# Patient Record
Sex: Female | Born: 1960
Health system: Southern US, Community
[De-identification: ages and names within clinical notes are randomized; demographics above are authoritative.]

## PROBLEM LIST (undated history)

## (undated) DIAGNOSIS — F419 Anxiety disorder, unspecified: Secondary | ICD-10-CM

## (undated) DIAGNOSIS — N3090 Cystitis, unspecified without hematuria: Secondary | ICD-10-CM

## (undated) DIAGNOSIS — K602 Anal fissure, unspecified: Secondary | ICD-10-CM

## (undated) HISTORY — DX: Anal fissure, unspecified: K60.2

## (undated) HISTORY — PX: TONSILLECTOMY: SUR1361

## (undated) HISTORY — PX: TOE SURGERY: SHX1073

## (undated) HISTORY — DX: Anxiety disorder, unspecified: F41.9

## (undated) HISTORY — DX: Cystitis, unspecified without hematuria: N30.90

---

## 2005-06-17 ENCOUNTER — Ambulatory Visit (HOSPITAL_BASED_OUTPATIENT_CLINIC_OR_DEPARTMENT_OTHER): Admission: RE | Admit: 2005-06-17 | Discharge: 2005-06-17 | Payer: Self-pay | Admitting: Urology

## 2013-07-27 DIAGNOSIS — N9489 Other specified conditions associated with female genital organs and menstrual cycle: Secondary | ICD-10-CM | POA: Insufficient documentation

## 2013-07-27 DIAGNOSIS — N3010 Interstitial cystitis (chronic) without hematuria: Secondary | ICD-10-CM | POA: Insufficient documentation

## 2014-07-27 DIAGNOSIS — F419 Anxiety disorder, unspecified: Secondary | ICD-10-CM | POA: Insufficient documentation

## 2014-07-27 DIAGNOSIS — R030 Elevated blood-pressure reading, without diagnosis of hypertension: Secondary | ICD-10-CM | POA: Insufficient documentation

## 2015-03-07 ENCOUNTER — Other Ambulatory Visit: Payer: Self-pay | Admitting: *Deleted

## 2015-03-07 LAB — FERRITIN: Ferritin: 98 ng/mL (ref 10–291)

## 2015-03-07 LAB — TSH: TSH: 1.036 u[IU]/mL (ref 0.350–4.500)

## 2015-03-07 NOTE — Addendum Note (Signed)
Addended by: Eulis Foster on: 03/07/2015 02:20 PM   Modules accepted: Orders

## 2015-03-08 LAB — THYROID PEROXIDASE ANTIBODY: Thyroperoxidase Ab SerPl-aCnc: <1 IU/mL (ref <9)

## 2015-03-08 LAB — DHEA-SULFATE: DHEA-SO4: 60 ug/dL (ref 8–188)

## 2015-07-10 DIAGNOSIS — Z8349 Family history of other endocrine, nutritional and metabolic diseases: Secondary | ICD-10-CM | POA: Diagnosis not present

## 2015-07-10 DIAGNOSIS — R209 Unspecified disturbances of skin sensation: Secondary | ICD-10-CM | POA: Diagnosis not present

## 2015-07-10 DIAGNOSIS — R899 Unspecified abnormal finding in specimens from other organs, systems and tissues: Secondary | ICD-10-CM | POA: Diagnosis not present

## 2015-08-21 DIAGNOSIS — I10 Essential (primary) hypertension: Secondary | ICD-10-CM | POA: Diagnosis not present

## 2015-08-21 DIAGNOSIS — F419 Anxiety disorder, unspecified: Secondary | ICD-10-CM | POA: Diagnosis not present

## 2015-08-21 DIAGNOSIS — B009 Herpesviral infection, unspecified: Secondary | ICD-10-CM | POA: Diagnosis not present

## 2015-10-18 ENCOUNTER — Ambulatory Visit: Payer: Self-pay | Admitting: Podiatry

## 2015-10-31 DIAGNOSIS — Z713 Dietary counseling and surveillance: Secondary | ICD-10-CM | POA: Diagnosis not present

## 2015-11-06 ENCOUNTER — Ambulatory Visit: Payer: Self-pay | Admitting: Podiatry

## 2015-11-21 DIAGNOSIS — Z713 Dietary counseling and surveillance: Secondary | ICD-10-CM | POA: Diagnosis not present

## 2015-12-12 DIAGNOSIS — Z713 Dietary counseling and surveillance: Secondary | ICD-10-CM | POA: Diagnosis not present

## 2015-12-26 DIAGNOSIS — Z713 Dietary counseling and surveillance: Secondary | ICD-10-CM | POA: Diagnosis not present

## 2016-01-24 DIAGNOSIS — N9489 Other specified conditions associated with female genital organs and menstrual cycle: Secondary | ICD-10-CM | POA: Diagnosis not present

## 2016-01-24 DIAGNOSIS — N3010 Interstitial cystitis (chronic) without hematuria: Secondary | ICD-10-CM | POA: Diagnosis not present

## 2016-01-30 DIAGNOSIS — H5213 Myopia, bilateral: Secondary | ICD-10-CM | POA: Diagnosis not present

## 2016-02-27 DIAGNOSIS — Z713 Dietary counseling and surveillance: Secondary | ICD-10-CM | POA: Diagnosis not present

## 2016-03-05 DIAGNOSIS — R202 Paresthesia of skin: Secondary | ICD-10-CM | POA: Diagnosis not present

## 2016-03-05 DIAGNOSIS — R030 Elevated blood-pressure reading, without diagnosis of hypertension: Secondary | ICD-10-CM | POA: Diagnosis not present

## 2016-03-05 DIAGNOSIS — E559 Vitamin D deficiency, unspecified: Secondary | ICD-10-CM | POA: Diagnosis not present

## 2016-03-05 DIAGNOSIS — E270 Other adrenocortical overactivity: Secondary | ICD-10-CM | POA: Diagnosis not present

## 2016-03-05 DIAGNOSIS — Z658 Other specified problems related to psychosocial circumstances: Secondary | ICD-10-CM | POA: Diagnosis not present

## 2016-03-05 DIAGNOSIS — R635 Abnormal weight gain: Secondary | ICD-10-CM | POA: Diagnosis not present

## 2016-03-05 DIAGNOSIS — Z0000 Encounter for general adult medical examination without abnormal findings: Secondary | ICD-10-CM | POA: Diagnosis not present

## 2016-03-12 DIAGNOSIS — Z01419 Encounter for gynecological examination (general) (routine) without abnormal findings: Secondary | ICD-10-CM | POA: Diagnosis not present

## 2016-03-12 DIAGNOSIS — I10 Essential (primary) hypertension: Secondary | ICD-10-CM | POA: Diagnosis not present

## 2016-03-12 DIAGNOSIS — N898 Other specified noninflammatory disorders of vagina: Secondary | ICD-10-CM | POA: Diagnosis not present

## 2016-03-12 DIAGNOSIS — Z79890 Hormone replacement therapy (postmenopausal): Secondary | ICD-10-CM | POA: Diagnosis not present

## 2016-03-12 DIAGNOSIS — Z1151 Encounter for screening for human papillomavirus (HPV): Secondary | ICD-10-CM | POA: Diagnosis not present

## 2016-03-18 DIAGNOSIS — R102 Pelvic and perineal pain: Secondary | ICD-10-CM | POA: Diagnosis not present

## 2016-03-27 DIAGNOSIS — L821 Other seborrheic keratosis: Secondary | ICD-10-CM | POA: Diagnosis not present

## 2016-03-27 DIAGNOSIS — L814 Other melanin hyperpigmentation: Secondary | ICD-10-CM | POA: Diagnosis not present

## 2016-03-27 DIAGNOSIS — L603 Nail dystrophy: Secondary | ICD-10-CM | POA: Diagnosis not present

## 2016-03-27 DIAGNOSIS — D225 Melanocytic nevi of trunk: Secondary | ICD-10-CM | POA: Diagnosis not present

## 2016-03-28 DIAGNOSIS — R1030 Lower abdominal pain, unspecified: Secondary | ICD-10-CM | POA: Diagnosis not present

## 2016-03-28 DIAGNOSIS — R3911 Hesitancy of micturition: Secondary | ICD-10-CM | POA: Diagnosis not present

## 2016-03-28 DIAGNOSIS — R030 Elevated blood-pressure reading, without diagnosis of hypertension: Secondary | ICD-10-CM | POA: Diagnosis not present

## 2016-04-11 DIAGNOSIS — J069 Acute upper respiratory infection, unspecified: Secondary | ICD-10-CM | POA: Diagnosis not present

## 2016-04-16 ENCOUNTER — Ambulatory Visit: Payer: Self-pay | Admitting: Podiatry

## 2016-06-02 DIAGNOSIS — R6889 Other general symptoms and signs: Secondary | ICD-10-CM | POA: Diagnosis not present

## 2016-06-02 DIAGNOSIS — R002 Palpitations: Secondary | ICD-10-CM | POA: Diagnosis not present

## 2016-06-02 DIAGNOSIS — R0789 Other chest pain: Secondary | ICD-10-CM | POA: Diagnosis not present

## 2016-07-24 DIAGNOSIS — N9489 Other specified conditions associated with female genital organs and menstrual cycle: Secondary | ICD-10-CM | POA: Diagnosis not present

## 2016-07-24 DIAGNOSIS — N3010 Interstitial cystitis (chronic) without hematuria: Secondary | ICD-10-CM | POA: Diagnosis not present

## 2016-12-10 DIAGNOSIS — Z713 Dietary counseling and surveillance: Secondary | ICD-10-CM | POA: Diagnosis not present

## 2016-12-31 DIAGNOSIS — Z713 Dietary counseling and surveillance: Secondary | ICD-10-CM | POA: Diagnosis not present

## 2017-01-28 DIAGNOSIS — Z713 Dietary counseling and surveillance: Secondary | ICD-10-CM | POA: Diagnosis not present

## 2017-01-29 DIAGNOSIS — N3010 Interstitial cystitis (chronic) without hematuria: Secondary | ICD-10-CM | POA: Diagnosis not present

## 2017-04-08 DIAGNOSIS — Z713 Dietary counseling and surveillance: Secondary | ICD-10-CM | POA: Diagnosis not present

## 2017-05-27 DIAGNOSIS — Z713 Dietary counseling and surveillance: Secondary | ICD-10-CM | POA: Diagnosis not present

## 2017-06-02 DIAGNOSIS — F439 Reaction to severe stress, unspecified: Secondary | ICD-10-CM | POA: Diagnosis not present

## 2017-06-02 DIAGNOSIS — R6889 Other general symptoms and signs: Secondary | ICD-10-CM | POA: Diagnosis not present

## 2017-06-02 DIAGNOSIS — E538 Deficiency of other specified B group vitamins: Secondary | ICD-10-CM | POA: Diagnosis not present

## 2017-07-23 DIAGNOSIS — R350 Frequency of micturition: Secondary | ICD-10-CM | POA: Insufficient documentation

## 2017-07-23 DIAGNOSIS — Z87440 Personal history of urinary (tract) infections: Secondary | ICD-10-CM | POA: Insufficient documentation

## 2017-07-23 DIAGNOSIS — N9489 Other specified conditions associated with female genital organs and menstrual cycle: Secondary | ICD-10-CM | POA: Diagnosis not present

## 2017-07-23 DIAGNOSIS — N3010 Interstitial cystitis (chronic) without hematuria: Secondary | ICD-10-CM | POA: Diagnosis not present

## 2017-10-08 ENCOUNTER — Ambulatory Visit: Payer: Self-pay | Admitting: Podiatry

## 2017-10-22 ENCOUNTER — Ambulatory Visit: Payer: Self-pay | Admitting: Podiatry

## 2017-10-29 DIAGNOSIS — L570 Actinic keratosis: Secondary | ICD-10-CM | POA: Diagnosis not present

## 2017-10-29 DIAGNOSIS — L603 Nail dystrophy: Secondary | ICD-10-CM | POA: Diagnosis not present

## 2017-10-29 DIAGNOSIS — L609 Nail disorder, unspecified: Secondary | ICD-10-CM | POA: Diagnosis not present

## 2017-11-17 ENCOUNTER — Encounter: Payer: Self-pay | Admitting: Podiatry

## 2017-11-17 ENCOUNTER — Ambulatory Visit: Payer: BLUE CROSS/BLUE SHIELD | Admitting: Podiatry

## 2017-11-17 DIAGNOSIS — A499 Bacterial infection, unspecified: Secondary | ICD-10-CM | POA: Diagnosis not present

## 2017-11-17 DIAGNOSIS — L608 Other nail disorders: Secondary | ICD-10-CM | POA: Diagnosis not present

## 2017-11-17 DIAGNOSIS — B351 Tinea unguium: Secondary | ICD-10-CM

## 2017-11-17 DIAGNOSIS — L603 Nail dystrophy: Secondary | ICD-10-CM | POA: Diagnosis not present

## 2017-11-17 NOTE — Progress Notes (Signed)
   Subjective:    Patient ID: Crystal Ware, female    DOB: 1961/02/03, 57 y.o.   MRN: [id number]  HPI 57 year old female presents the office today for concerns of thick, discolored toenails and she states that the left big toenail did crack come off partially.  It is mostly to both great toes as well as left second toenail.  This is been ongoing issue for her.  She had no treatment.  She does trim her nails at times.  She has no other concerns.   Review of Systems  All other systems reviewed and are negative.  History reviewed. No pertinent past medical history.  History reviewed. No pertinent surgical history.  No current outpatient medications on file.  No Known Allergies       Objective:   Physical Exam  General: AAO x3, NAD  Dermatological: Bilateral hallux nails as well as second digit toenails are hypertrophic, dystrophic with yellow-brown discoloration.  The other nails have some mild discoloration as well.  There is no pain in the nails there is evidence of any redness or drainage or any signs of infection.  There is no open lesions.  Vascular: Dorsalis Pedis artery and Posterior Tibial artery pedal pulses are 2/4 bilateral with immedate capillary fill time. There is no pain with calf compression, swelling, warmth, erythema.   Neruologic: Grossly intact via light touch bilateral.  Protective threshold with Semmes Wienstein monofilament intact to all pedal sites bilateral.   Musculoskeletal: HAV present. Muscular strength 5/5 in all groups tested bilateral.  Gait: Unassisted, Nonantalgic.     Assessment & Plan:  57 year old female with onychodystrophy, likely onychomycosis -Treatment options discussed including all alternatives, risks, and complications -Etiology of symptoms were discussed -We discussed nails as well as treatment.  I had seen her when she came to bring her aunt for nail trim we discussed options as well.  I did debride the nails today and sent this for  culture, pathology to [facility] labs.  We will call her once we have the results back on the culture.  In the meantime encouraged to call any questions or concerns any changes.  Trula Slade DPM

## 2017-11-17 NOTE — Patient Instructions (Signed)
If was nice to meet you today. If you have any questions or any further concerns, please feel fee to give me a call. You can call our office at [phone] or please feel fee to send me a message through MyChart.   

## 2017-11-18 DIAGNOSIS — R6889 Other general symptoms and signs: Secondary | ICD-10-CM | POA: Diagnosis not present

## 2017-11-18 DIAGNOSIS — L609 Nail disorder, unspecified: Secondary | ICD-10-CM | POA: Diagnosis not present

## 2017-11-18 DIAGNOSIS — Q383 Other congenital malformations of tongue: Secondary | ICD-10-CM | POA: Diagnosis not present

## 2017-11-25 DIAGNOSIS — H01001 Unspecified blepharitis right upper eyelid: Secondary | ICD-10-CM | POA: Diagnosis not present

## 2017-11-27 ENCOUNTER — Telehealth: Payer: Self-pay | Admitting: *Deleted

## 2017-11-27 NOTE — Telephone Encounter (Signed)
-----   Message from Trula Slade, DPM sent at 11/27/2017  7:23 AM EDT ----- Val- please let her know that the nail culture shows a bacterial infection in the nail. Can you please prescribe topical erythromycin ointment for the nail. She should do this for the next several weeks as the nail grows out.

## 2017-12-01 ENCOUNTER — Telehealth: Payer: Self-pay | Admitting: *Deleted

## 2017-12-01 MED ORDER — GENTAMICIN SULFATE 0.1 % EX OINT: TOPICAL_OINTMENT | CUTANEOUS | 2 refills | Status: DC

## 2017-12-01 MED ORDER — GENTAMICIN SULFATE 0.1 % EX OINT: 1.0000 "application " | TOPICAL_OINTMENT | Freq: Three times a day (TID) | CUTANEOUS | 2 refills | Status: DC

## 2017-12-01 NOTE — Telephone Encounter (Signed)
-----   Message from Trula Slade, DPM sent at 12/01/2017  2:44 PM EDT ----- Sorry, lets do topical gentamycin ointment  ----- Message ----- From: Andres Ege, RN Sent: 12/01/2017   9:59 AM EDT To: Trula Slade, DPM  Dr. Jacqualyn Posey, I need help with the prescription, I am not sure what I have found is what you are wanting. Marcy Siren ----- Message ----- From: Trula Slade, DPM Sent: 11/27/2017   7:23 AM EDT To: Andres Ege, RN  Val- please let her know that the nail culture shows a bacterial infection in the nail. Can you please prescribe topical erythromycin ointment for the nail. She should do this for the next several weeks as the nail grows out.

## 2017-12-01 NOTE — Telephone Encounter (Signed)
Dr. Jacqualyn Posey states Crystal Ware should continue the application of the gentamicin ointment to the right and left 1, 2nd toes for 3-4 weeks and cover with light dressing or clean white cotton socks.

## 2017-12-01 NOTE — Telephone Encounter (Signed)
Thanks. She is more than welcome to see her PCP for his. I would use the medication for the next several weeks 2 times a day to treat the bacterial infection in the nails. Consider removing the nails if needed. If it going to take a long time for the nails to grow out. She is also more than welcome to come in and we can discuss in more detail. Thanks.

## 2017-12-01 NOTE — Telephone Encounter (Signed)
Left message informing pt, Dr. Jacqualyn Posey had reviewed the test results and I could go over them with her.

## 2017-12-01 NOTE — Telephone Encounter (Signed)
Pt asked if she should see her PCP, because the toenails are a window into the body's health, or how many toes she should use it on, how many times a day and for how long. I told pt I would inform Dr. Jacqualyn Posey of her questions and call again. Pt states she would call tomorrow.

## 2017-12-01 NOTE — Telephone Encounter (Signed)
Faxed orders to [facility].

## 2017-12-04 ENCOUNTER — Telehealth: Payer: Self-pay | Admitting: Podiatry

## 2017-12-04 NOTE — Telephone Encounter (Signed)
Crystal Ware, this is Crystal Ware. I spoke with you earlier in the week about a prescription for an ointment you were going to call in for my toenails. I have a couple of more questions. If you get a chance to give me a call back today, I can let you know what I wanted to find out from Dr. Jacqualyn Posey. If you don't get a chance to call me back today, you could call me back Monday after 10 am. If you could try calling my home number first which is [phone] because I don't always have a good cell signal. Thanks.

## 2017-12-07 ENCOUNTER — Telehealth: Payer: Self-pay | Admitting: Podiatry

## 2017-12-07 NOTE — Telephone Encounter (Signed)
I spoke with Crystal Ware and answered her question of 12/04/2017, to apply the ointment 1-2 times daily for 2-3 weeks with a Q-tip then cover areas with bandaid or clean white cotton sock. I told Crystal Ware we had not received the PRE-cert fax from [facility] yet and I gave Crystal Ware my fax number [phone]. Crystal Ware requested copy of the fungal culture results.  I spoke with Dr. Jacqualyn Posey and he stated he would like to see her in one month. I mailed copy of fungal culture results and noted on the back of the results, Dr. Jacqualyn Posey wanted to see her in one month.

## 2017-12-07 NOTE — Telephone Encounter (Addendum)
Crystal Ware states she will fax a pre-cert for for gentamicin ointment to be completed and return. I gave the [phone] for fax.

## 2017-12-07 NOTE — Telephone Encounter (Signed)
Patient is wanting to know have you received a fax for her for a prior auth through [employer].

## 2017-12-09 ENCOUNTER — Telehealth: Payer: Self-pay | Admitting: *Deleted

## 2017-12-09 ENCOUNTER — Telehealth: Payer: Self-pay | Admitting: Podiatry

## 2017-12-09 DIAGNOSIS — L570 Actinic keratosis: Secondary | ICD-10-CM | POA: Diagnosis not present

## 2017-12-09 NOTE — Telephone Encounter (Signed)
Pt states that the gentamicin ointment is not covered, but the insurance will cover bactroban ointment. I told pt I would check with Dr. Jacqualyn Posey if he would like to change to the bactroban ointment but it may not cover the bacteria her toenails have. Pt states she will get the gentamicin ointment no matter the cost, I told pt I would ask Dr. Jacqualyn Posey. Then pt states no don't do that, she'll get the gentamicin ointment. I told her I would let Dr. Jacqualyn Posey know she is starting the ointment. Pt states she wants to know if that is what he wants her on and I told that he prescribed it specifically and she states the insurance said they would cover the gentamicin cream. I told her I could change to the gentamicin cream. Pt stated no, she thought the ointment may be absorbed better, I told her I could check.

## 2017-12-09 NOTE — Telephone Encounter (Signed)
[employer] denied coverage of gentamicin ointment.

## 2017-12-09 NOTE — Telephone Encounter (Signed)
This is [place] from [place]. I'm calling in regards to the gentamicin sulfate received on Ms. Schoneman. This medication has been denied and the member has been made of this determination. If you have any questions, feel free to call our provider line anytime.

## 2017-12-10 NOTE — Telephone Encounter (Signed)
Can also consider cipro drops with dexamethasone otic suspension to apply to the TOENAIL. This may be do better for her and I discussed this with Dr. Milinda Pointer and that is what he does for this.

## 2017-12-11 ENCOUNTER — Encounter: Payer: Self-pay | Admitting: Podiatry

## 2017-12-11 ENCOUNTER — Ambulatory Visit (INDEPENDENT_AMBULATORY_CARE_PROVIDER_SITE_OTHER): Payer: BLUE CROSS/BLUE SHIELD

## 2017-12-11 ENCOUNTER — Ambulatory Visit: Payer: BLUE CROSS/BLUE SHIELD | Admitting: Podiatry

## 2017-12-11 DIAGNOSIS — M79675 Pain in left toe(s): Secondary | ICD-10-CM

## 2017-12-11 DIAGNOSIS — R6889 Other general symptoms and signs: Secondary | ICD-10-CM | POA: Insufficient documentation

## 2017-12-11 DIAGNOSIS — R5383 Other fatigue: Secondary | ICD-10-CM | POA: Insufficient documentation

## 2017-12-11 DIAGNOSIS — E270 Other adrenocortical overactivity: Secondary | ICD-10-CM | POA: Insufficient documentation

## 2017-12-11 DIAGNOSIS — Z8739 Personal history of other diseases of the musculoskeletal system and connective tissue: Secondary | ICD-10-CM

## 2017-12-11 DIAGNOSIS — R635 Abnormal weight gain: Secondary | ICD-10-CM | POA: Insufficient documentation

## 2017-12-11 DIAGNOSIS — B351 Tinea unguium: Secondary | ICD-10-CM

## 2017-12-11 DIAGNOSIS — Z709 Sex counseling, unspecified: Secondary | ICD-10-CM | POA: Insufficient documentation

## 2017-12-11 MED ORDER — GENTAMICIN SULFATE 0.1 % EX CREA: TOPICAL_CREAM | CUTANEOUS | 2 refills | Status: DC

## 2017-12-11 MED ORDER — CIPROFLOXACIN HCL 500 MG PO TABS: 500.0000 mg | ORAL_TABLET | Freq: Two times a day (BID) | ORAL | 0 refills | Status: DC

## 2017-12-11 NOTE — Patient Instructions (Signed)

## 2017-12-11 NOTE — Telephone Encounter (Signed)
I told pt I would explain everything Dr. Jacqualyn Posey had said and then answer her questions. I informed pt Dr. Jacqualyn Posey stated it did not matter if she used the cream or ointment, and I offered the cortisporin otic drops, and that most insurance would not cover the drops, but I would order if she wanted. Pt states she wanted me to call in the gentamicin cream.  Pt stated she would like to be seen by Dr. Jacqualyn Posey before the weekend and because she said she had osteomyelitis in that toe and both were red. I ordered gentamicin cream and pt is scheduled to see Dr. Jacqualyn Posey today.

## 2017-12-15 NOTE — Progress Notes (Signed)
Subjective: 57 year old female presents the office today for evaluation of her left big toenail.  She states that she has had a history of osteomyelitis but still requiring surgery and given the bacterial infection that came up on the nail culture results she was to have the area further evaluated.  She states that she previously did have some redness on the toenail but she currently is not experiencing any of this.  She currently denies any pain she has had no drainage or pus coming from the toenail.  She has not yet received the gentamicin that I ordered for her topically to her insurance and she was to discuss this today. Denies any systemic complaints such as fevers, chills, nausea, vomiting. No acute changes since last appointment, and no other complaints at this time.   Objective: AAO x3, NAD DP/PT pulses palpable bilaterally, CRT less than 3 seconds Minimal incurvation present to both medial lateral aspects of bilateral hallux and second digit toenails.  The left hallux toenail appears to be somewhat discolored with yellow-brown discoloration but there is no other discoloration present.  There is no pain in the nail there is no swelling redness or drainage or any of the toenails there is no clinical signs of infection today.  There is no swelling.  No areas of tenderness. No open lesions or pre-ulcerative lesions.  No pain with calf compression, swelling, warmth, erythema  Assessment: Left hallux toenail onychomycosis  Plan: -All treatment options discussed with the patient including all alternatives, risks, complications.  -X-rays have been reviewed.  No definitive cortical destruction suggestive of osteomyelitis at this time and there is no fracture. -I again reviewed the nail culture results with her.  Discussed with her that although it does show Pseudomonas on the culture this could be not a true pathogen however given her history I would like to treat her for this.  She is very concerned  about possible infection to ensure there has some redness previously.  I am not able to identify this today but she is very nervous about this.  Given the Pseudomonas and letter prescribed ciprofloxacin for her.  Also discussed gentamicin she is getting use the ointment.  Discussed her application instructions.  There is no set timeframe on how long to use this form out when check her back in a couple weeks to see how she is doing. -Patient encouraged to call the office with any questions, concerns, change in symptoms.   Trula Slade DPM

## 2018-01-04 DIAGNOSIS — H2513 Age-related nuclear cataract, bilateral: Secondary | ICD-10-CM | POA: Diagnosis not present

## 2018-01-04 DIAGNOSIS — H01001 Unspecified blepharitis right upper eyelid: Secondary | ICD-10-CM | POA: Diagnosis not present

## 2018-01-04 DIAGNOSIS — H524 Presbyopia: Secondary | ICD-10-CM | POA: Diagnosis not present

## 2018-01-04 DIAGNOSIS — H5213 Myopia, bilateral: Secondary | ICD-10-CM | POA: Diagnosis not present

## 2018-01-26 DIAGNOSIS — R339 Retention of urine, unspecified: Secondary | ICD-10-CM | POA: Diagnosis not present

## 2018-01-26 DIAGNOSIS — N3010 Interstitial cystitis (chronic) without hematuria: Secondary | ICD-10-CM | POA: Diagnosis not present

## 2018-01-26 DIAGNOSIS — N9489 Other specified conditions associated with female genital organs and menstrual cycle: Secondary | ICD-10-CM | POA: Diagnosis not present

## 2018-02-02 ENCOUNTER — Ambulatory Visit: Payer: BLUE CROSS/BLUE SHIELD | Admitting: Podiatry

## 2018-03-02 DIAGNOSIS — F439 Reaction to severe stress, unspecified: Secondary | ICD-10-CM | POA: Diagnosis not present

## 2018-03-02 DIAGNOSIS — Z0000 Encounter for general adult medical examination without abnormal findings: Secondary | ICD-10-CM | POA: Diagnosis not present

## 2018-03-02 DIAGNOSIS — R6889 Other general symptoms and signs: Secondary | ICD-10-CM | POA: Diagnosis not present

## 2018-03-02 DIAGNOSIS — R030 Elevated blood-pressure reading, without diagnosis of hypertension: Secondary | ICD-10-CM | POA: Diagnosis not present

## 2018-03-02 DIAGNOSIS — I499 Cardiac arrhythmia, unspecified: Secondary | ICD-10-CM | POA: Diagnosis not present

## 2018-03-16 DIAGNOSIS — I499 Cardiac arrhythmia, unspecified: Secondary | ICD-10-CM | POA: Diagnosis not present

## 2018-03-16 DIAGNOSIS — E559 Vitamin D deficiency, unspecified: Secondary | ICD-10-CM | POA: Diagnosis not present

## 2018-03-16 DIAGNOSIS — R6889 Other general symptoms and signs: Secondary | ICD-10-CM | POA: Diagnosis not present

## 2018-03-16 DIAGNOSIS — R030 Elevated blood-pressure reading, without diagnosis of hypertension: Secondary | ICD-10-CM | POA: Diagnosis not present

## 2018-05-26 DIAGNOSIS — R319 Hematuria, unspecified: Secondary | ICD-10-CM | POA: Diagnosis not present

## 2018-09-20 DIAGNOSIS — L719 Rosacea, unspecified: Secondary | ICD-10-CM | POA: Diagnosis not present

## 2018-09-20 DIAGNOSIS — L821 Other seborrheic keratosis: Secondary | ICD-10-CM | POA: Diagnosis not present

## 2018-09-20 DIAGNOSIS — L814 Other melanin hyperpigmentation: Secondary | ICD-10-CM | POA: Diagnosis not present

## 2018-09-20 DIAGNOSIS — Z1283 Encounter for screening for malignant neoplasm of skin: Secondary | ICD-10-CM | POA: Diagnosis not present

## 2018-10-19 DIAGNOSIS — Z87440 Personal history of urinary (tract) infections: Secondary | ICD-10-CM | POA: Diagnosis not present

## 2018-10-19 DIAGNOSIS — N9489 Other specified conditions associated with female genital organs and menstrual cycle: Secondary | ICD-10-CM | POA: Diagnosis not present

## 2018-10-19 DIAGNOSIS — N3010 Interstitial cystitis (chronic) without hematuria: Secondary | ICD-10-CM | POA: Diagnosis not present

## 2019-01-18 ENCOUNTER — Ambulatory Visit (INDEPENDENT_AMBULATORY_CARE_PROVIDER_SITE_OTHER): Payer: Self-pay | Admitting: Internal Medicine

## 2019-01-25 DIAGNOSIS — M6289 Other specified disorders of muscle: Secondary | ICD-10-CM | POA: Diagnosis not present

## 2019-01-25 DIAGNOSIS — M6281 Muscle weakness (generalized): Secondary | ICD-10-CM | POA: Diagnosis not present

## 2019-01-25 DIAGNOSIS — R102 Pelvic and perineal pain: Secondary | ICD-10-CM | POA: Diagnosis not present

## 2019-01-25 DIAGNOSIS — M62838 Other muscle spasm: Secondary | ICD-10-CM | POA: Diagnosis not present

## 2019-01-31 DIAGNOSIS — M6281 Muscle weakness (generalized): Secondary | ICD-10-CM | POA: Diagnosis not present

## 2019-01-31 DIAGNOSIS — M6289 Other specified disorders of muscle: Secondary | ICD-10-CM | POA: Diagnosis not present

## 2019-01-31 DIAGNOSIS — M62838 Other muscle spasm: Secondary | ICD-10-CM | POA: Diagnosis not present

## 2019-01-31 DIAGNOSIS — R351 Nocturia: Secondary | ICD-10-CM | POA: Diagnosis not present

## 2019-02-14 DIAGNOSIS — H524 Presbyopia: Secondary | ICD-10-CM | POA: Diagnosis not present

## 2019-02-14 DIAGNOSIS — H2513 Age-related nuclear cataract, bilateral: Secondary | ICD-10-CM | POA: Diagnosis not present

## 2019-02-14 DIAGNOSIS — H01001 Unspecified blepharitis right upper eyelid: Secondary | ICD-10-CM | POA: Diagnosis not present

## 2019-02-16 DIAGNOSIS — M6289 Other specified disorders of muscle: Secondary | ICD-10-CM | POA: Diagnosis not present

## 2019-02-16 DIAGNOSIS — M62838 Other muscle spasm: Secondary | ICD-10-CM | POA: Diagnosis not present

## 2019-02-16 DIAGNOSIS — R102 Pelvic and perineal pain: Secondary | ICD-10-CM | POA: Diagnosis not present

## 2019-02-16 DIAGNOSIS — M6281 Muscle weakness (generalized): Secondary | ICD-10-CM | POA: Diagnosis not present

## 2019-04-19 DIAGNOSIS — Z09 Encounter for follow-up examination after completed treatment for conditions other than malignant neoplasm: Secondary | ICD-10-CM | POA: Diagnosis not present

## 2019-04-19 DIAGNOSIS — N9489 Other specified conditions associated with female genital organs and menstrual cycle: Secondary | ICD-10-CM | POA: Diagnosis not present

## 2019-04-19 DIAGNOSIS — R350 Frequency of micturition: Secondary | ICD-10-CM | POA: Diagnosis not present

## 2019-04-19 DIAGNOSIS — N3010 Interstitial cystitis (chronic) without hematuria: Secondary | ICD-10-CM | POA: Diagnosis not present

## 2019-08-03 DIAGNOSIS — R002 Palpitations: Secondary | ICD-10-CM | POA: Diagnosis not present

## 2019-08-03 DIAGNOSIS — Z658 Other specified problems related to psychosocial circumstances: Secondary | ICD-10-CM | POA: Diagnosis not present

## 2019-08-03 DIAGNOSIS — I10 Essential (primary) hypertension: Secondary | ICD-10-CM | POA: Diagnosis not present

## 2019-08-03 DIAGNOSIS — F5102 Adjustment insomnia: Secondary | ICD-10-CM | POA: Diagnosis not present

## 2019-08-04 DIAGNOSIS — I10 Essential (primary) hypertension: Secondary | ICD-10-CM | POA: Diagnosis not present

## 2019-09-29 DIAGNOSIS — Z658 Other specified problems related to psychosocial circumstances: Secondary | ICD-10-CM | POA: Diagnosis not present

## 2019-09-29 DIAGNOSIS — R002 Palpitations: Secondary | ICD-10-CM | POA: Diagnosis not present

## 2019-09-29 DIAGNOSIS — I10 Essential (primary) hypertension: Secondary | ICD-10-CM | POA: Diagnosis not present

## 2019-09-29 DIAGNOSIS — F5102 Adjustment insomnia: Secondary | ICD-10-CM | POA: Diagnosis not present

## 2019-11-08 DIAGNOSIS — R309 Painful micturition, unspecified: Secondary | ICD-10-CM | POA: Diagnosis not present

## 2019-11-08 DIAGNOSIS — N760 Acute vaginitis: Secondary | ICD-10-CM | POA: Diagnosis not present

## 2019-11-08 DIAGNOSIS — N898 Other specified noninflammatory disorders of vagina: Secondary | ICD-10-CM | POA: Diagnosis not present

## 2020-02-01 DIAGNOSIS — I10 Essential (primary) hypertension: Secondary | ICD-10-CM | POA: Diagnosis not present

## 2020-02-01 DIAGNOSIS — Z658 Other specified problems related to psychosocial circumstances: Secondary | ICD-10-CM | POA: Diagnosis not present

## 2020-02-01 DIAGNOSIS — E559 Vitamin D deficiency, unspecified: Secondary | ICD-10-CM | POA: Diagnosis not present

## 2020-02-01 DIAGNOSIS — M6289 Other specified disorders of muscle: Secondary | ICD-10-CM | POA: Diagnosis not present

## 2020-02-01 DIAGNOSIS — R002 Palpitations: Secondary | ICD-10-CM | POA: Diagnosis not present

## 2020-02-01 DIAGNOSIS — F5102 Adjustment insomnia: Secondary | ICD-10-CM | POA: Diagnosis not present

## 2020-03-08 DIAGNOSIS — N760 Acute vaginitis: Secondary | ICD-10-CM | POA: Diagnosis not present

## 2020-03-08 DIAGNOSIS — Z6827 Body mass index (BMI) 27.0-27.9, adult: Secondary | ICD-10-CM | POA: Diagnosis not present

## 2020-03-08 DIAGNOSIS — Z01419 Encounter for gynecological examination (general) (routine) without abnormal findings: Secondary | ICD-10-CM | POA: Diagnosis not present

## 2020-06-13 DIAGNOSIS — R635 Abnormal weight gain: Secondary | ICD-10-CM | POA: Diagnosis not present

## 2020-06-13 DIAGNOSIS — R202 Paresthesia of skin: Secondary | ICD-10-CM | POA: Diagnosis not present

## 2020-06-13 DIAGNOSIS — Z131 Encounter for screening for diabetes mellitus: Secondary | ICD-10-CM | POA: Diagnosis not present

## 2020-06-13 DIAGNOSIS — Z658 Other specified problems related to psychosocial circumstances: Secondary | ICD-10-CM | POA: Diagnosis not present

## 2020-07-25 DIAGNOSIS — M5136 Other intervertebral disc degeneration, lumbar region: Secondary | ICD-10-CM | POA: Diagnosis not present

## 2020-07-25 DIAGNOSIS — M79651 Pain in right thigh: Secondary | ICD-10-CM | POA: Diagnosis not present

## 2020-07-25 DIAGNOSIS — M4316 Spondylolisthesis, lumbar region: Secondary | ICD-10-CM | POA: Diagnosis not present

## 2020-08-23 ENCOUNTER — Ambulatory Visit: Payer: BLUE CROSS/BLUE SHIELD | Admitting: Neurology

## 2020-09-18 ENCOUNTER — Ambulatory Visit: Payer: BLUE CROSS/BLUE SHIELD | Admitting: Neurology

## 2020-10-23 ENCOUNTER — Ambulatory Visit: Payer: BLUE CROSS/BLUE SHIELD | Admitting: Podiatry

## 2020-10-30 DIAGNOSIS — M545 Low back pain, unspecified: Secondary | ICD-10-CM | POA: Diagnosis not present

## 2020-11-06 ENCOUNTER — Ambulatory Visit: Payer: BLUE CROSS/BLUE SHIELD | Admitting: Podiatry

## 2020-11-06 ENCOUNTER — Other Ambulatory Visit: Payer: Self-pay

## 2020-11-06 DIAGNOSIS — B351 Tinea unguium: Secondary | ICD-10-CM

## 2020-11-06 DIAGNOSIS — A498 Other bacterial infections of unspecified site: Secondary | ICD-10-CM | POA: Diagnosis not present

## 2020-11-06 MED ORDER — GENTAMICIN SULFATE 0.1 % EX CREA: TOPICAL_CREAM | CUTANEOUS | 2 refills | Status: DC

## 2020-11-07 ENCOUNTER — Other Ambulatory Visit: Payer: Self-pay | Admitting: Podiatry

## 2020-11-07 MED ORDER — GENTAMICIN SULFATE 0.1 % EX OINT: 1.0000 "application " | TOPICAL_OINTMENT | Freq: Three times a day (TID) | CUTANEOUS | 0 refills | Status: DC

## 2020-11-08 ENCOUNTER — Telehealth: Payer: Self-pay | Admitting: *Deleted

## 2020-11-08 NOTE — Telephone Encounter (Signed)
Called and spoke with the patient and relayed the message per Dr Jacqualyn Posey. Lattie Haw

## 2020-11-08 NOTE — Telephone Encounter (Signed)
-----   Message from Trula Slade, DPM sent at 11/07/2020  1:50 PM EDT ----- Regarding: RE: Correct medication Ointment sent ----- Message ----- From: Rivka Barbara Sent: 11/06/2020   3:33 PM EDT To: Viviana Simpler, PMAC, Trula Slade, DPM Subject: Correct medication                             Patient is requesting the ointment and not the cream. The gentamicin cream (GARAMYCIN) 0.1 % was sent in instead. Can you please send to the pharmacy the ointment instead

## 2020-11-12 NOTE — Progress Notes (Signed)
Subjective: 60 year old female presents the office today for concerns of nails becoming thickened discolored.  She says started getting worse again in the winter when she wore clogs and the socks are rubbing.  Because of the skin lesion on the third toe the scab came off last month.  She denies any swelling or redness or any drainage to the toenail sites at this time. Denies any systemic complaints such as fevers, chills, nausea, vomiting. No acute changes since last appointment, and no other complaints at this time.   Objective: AAO x3, NAD DP/PT pulses palpable bilaterally, CRT less than 3 seconds Nails appear to be mildly hypertrophic and dystrophic with brown thick yellow discoloration.  There is no green discoloration.  There is no edema, erythema or signs of infection.  Minimal hyperkeratotic tissue today.  No ongoing ulceration drainage or signs of infection.  No pain with calf compression, swelling, warmth, erythema  Assessment: Onychomycosis, concern for bacterial infection under the nail  Plan: -All treatment options discussed with the patient including all alternatives, risks, complications.  -Patient is concerned about bacterial infection.  She states the color is about the same as what it was previously.  As there is concern for this we will do gentamicin ointment to the nail.  Monitor for any signs or symptoms of infection.  There is no clinical signs of cellulitis today.  Once we treat this then we will likely do topical antifungal. -She has some calluses on her feet recommend moisturizer daily. -Patient encouraged to call the office with any questions, concerns, change in symptoms.   Trula Slade DPM

## 2020-12-04 ENCOUNTER — Ambulatory Visit: Payer: BLUE CROSS/BLUE SHIELD | Admitting: Podiatry

## 2020-12-10 DIAGNOSIS — M545 Low back pain, unspecified: Secondary | ICD-10-CM | POA: Diagnosis not present

## 2020-12-10 DIAGNOSIS — M5416 Radiculopathy, lumbar region: Secondary | ICD-10-CM | POA: Diagnosis not present

## 2020-12-26 DIAGNOSIS — M5416 Radiculopathy, lumbar region: Secondary | ICD-10-CM | POA: Diagnosis not present

## 2021-02-13 DIAGNOSIS — R202 Paresthesia of skin: Secondary | ICD-10-CM | POA: Diagnosis not present

## 2021-02-13 DIAGNOSIS — Z658 Other specified problems related to psychosocial circumstances: Secondary | ICD-10-CM | POA: Diagnosis not present

## 2021-02-13 DIAGNOSIS — R7309 Other abnormal glucose: Secondary | ICD-10-CM | POA: Diagnosis not present

## 2021-02-13 DIAGNOSIS — R635 Abnormal weight gain: Secondary | ICD-10-CM | POA: Diagnosis not present

## 2021-02-26 DIAGNOSIS — M545 Low back pain, unspecified: Secondary | ICD-10-CM | POA: Diagnosis not present

## 2021-03-06 DIAGNOSIS — M5416 Radiculopathy, lumbar region: Secondary | ICD-10-CM | POA: Diagnosis not present

## 2021-03-20 ENCOUNTER — Other Ambulatory Visit: Payer: Self-pay | Admitting: Orthopedic Surgery

## 2021-03-20 ENCOUNTER — Other Ambulatory Visit (HOSPITAL_COMMUNITY): Payer: Self-pay | Admitting: Orthopedic Surgery

## 2021-03-20 DIAGNOSIS — M5459 Other low back pain: Secondary | ICD-10-CM

## 2021-03-20 DIAGNOSIS — M5416 Radiculopathy, lumbar region: Secondary | ICD-10-CM | POA: Diagnosis not present

## 2021-03-27 ENCOUNTER — Other Ambulatory Visit: Payer: Self-pay

## 2021-03-27 ENCOUNTER — Ambulatory Visit (HOSPITAL_COMMUNITY)
Admission: RE | Admit: 2021-03-27 | Discharge: 2021-03-27 | Disposition: A | Discharge status: Home / Self Care | Payer: BLUE CROSS/BLUE SHIELD | Source: Ambulatory Visit | Attending: Orthopedic Surgery | Admitting: Orthopedic Surgery

## 2021-03-27 DIAGNOSIS — M47816 Spondylosis without myelopathy or radiculopathy, lumbar region: Secondary | ICD-10-CM | POA: Diagnosis not present

## 2021-03-27 DIAGNOSIS — M545 Low back pain, unspecified: Secondary | ICD-10-CM | POA: Diagnosis not present

## 2021-03-27 DIAGNOSIS — M5459 Other low back pain: Secondary | ICD-10-CM | POA: Diagnosis not present

## 2021-03-29 ENCOUNTER — Ambulatory Visit (HOSPITAL_COMMUNITY): Payer: Self-pay

## 2021-04-09 DIAGNOSIS — M5459 Other low back pain: Secondary | ICD-10-CM | POA: Diagnosis not present

## 2021-04-16 DIAGNOSIS — M545 Low back pain, unspecified: Secondary | ICD-10-CM | POA: Diagnosis not present

## 2021-04-23 DIAGNOSIS — R319 Hematuria, unspecified: Secondary | ICD-10-CM | POA: Diagnosis not present

## 2021-04-23 DIAGNOSIS — N760 Acute vaginitis: Secondary | ICD-10-CM | POA: Diagnosis not present

## 2021-04-30 DIAGNOSIS — M5459 Other low back pain: Secondary | ICD-10-CM | POA: Diagnosis not present

## 2021-05-22 DIAGNOSIS — M5459 Other low back pain: Secondary | ICD-10-CM | POA: Diagnosis not present

## 2021-05-22 DIAGNOSIS — M5416 Radiculopathy, lumbar region: Secondary | ICD-10-CM | POA: Diagnosis not present

## 2021-06-17 DIAGNOSIS — M5416 Radiculopathy, lumbar region: Secondary | ICD-10-CM | POA: Diagnosis not present

## 2021-07-16 ENCOUNTER — Telehealth: Payer: Self-pay | Admitting: Podiatry

## 2021-07-16 ENCOUNTER — Ambulatory Visit: Payer: BLUE CROSS/BLUE SHIELD | Admitting: Podiatry

## 2021-07-16 DIAGNOSIS — M7989 Other specified soft tissue disorders: Secondary | ICD-10-CM | POA: Diagnosis not present

## 2021-07-16 DIAGNOSIS — L853 Xerosis cutis: Secondary | ICD-10-CM

## 2021-07-16 DIAGNOSIS — B351 Tinea unguium: Secondary | ICD-10-CM

## 2021-07-16 NOTE — Patient Instructions (Signed)
You can get "urea nail gel 40% for the nails" you can also alternate that with "tea tree oil" to help hydrate the nails.  ? ?Gentamicin cream for the big toenails ?

## 2021-07-16 NOTE — Telephone Encounter (Signed)
Pt called and got the avs and it says cellulitis and she did not remember discussing. Did she have this condition? ?

## 2021-07-17 NOTE — Telephone Encounter (Signed)
Notified pt of what Dr Jacqualyn Posey said.  ?She said thank you. ?

## 2021-07-20 NOTE — Progress Notes (Signed)
Subjective: ?61 year old female presents the office today for a couple of concerns.  She has a cyst on the right third toe that she does not have drainage.  She also looked at her toenails look that she started noticing discoloration of the corners.  She has not noticed some dry skin present on the toes.  Denies any swelling redness or drainage.  No open lesions. ? ?Objective: ?AAO x3, NAD ?DP/PT pulses palpable bilaterally, CRT less than 3 seconds ?On the dorsal aspect the right third IPJ is a fluid-filled soft tissue mass consistent with what appears to be mucoid cyst versus ganglion cyst.  We did aspirate this today and there is clear, gel-like fluid and there is no purulence or signs of infection.  Nails are mildly dystrophic and there discoloration of the tip, corners of the nails.  There is no edema, erythema or signs of infection.  There is mild dry skin the dorsal aspect the digits and there is no open sores.  No pain with calf compression, swelling, warmth, erythema ? ?Assessment: ?Soft tissue mass right third toe, onychodystrophy ? ?Plan: ?-All treatment options discussed with the patient including all alternatives, risks, complications.  ?-She had to proceed with aspiration of the cyst.  I cleaned the skin with alcohol and I froze the skin I utilized a 22-gauge needle in order to aspirate the cyst and clear, gel-like fluid was expressed.  No signs of infection.  Compression bandage applied.  We had a discussion regards further options for treatment including surgical excision but she want to hold off on this for now. ?-Lightly debrided some of the hyperkeratotic tissue to the any complications bleeding.  Recommend moisturizer for the dry skin. ?-Recommend returning to the topical medications for the toenails.  Also discussed urea for the nails.  Also tea tree oil to help hydrate them.  She is to use the gentamicin cream on the big toenails for the next couple weeks. ?-Patient encouraged to call the office  with any questions, concerns, change in symptoms.  ? ?Trula Slade DPM ? ?

## 2021-09-18 DIAGNOSIS — D72829 Elevated white blood cell count, unspecified: Secondary | ICD-10-CM | POA: Diagnosis not present

## 2021-09-18 DIAGNOSIS — Z13220 Encounter for screening for lipoid disorders: Secondary | ICD-10-CM | POA: Diagnosis not present

## 2021-09-18 DIAGNOSIS — Z658 Other specified problems related to psychosocial circumstances: Secondary | ICD-10-CM | POA: Diagnosis not present

## 2021-09-18 DIAGNOSIS — R350 Frequency of micturition: Secondary | ICD-10-CM | POA: Diagnosis not present

## 2021-09-18 DIAGNOSIS — E559 Vitamin D deficiency, unspecified: Secondary | ICD-10-CM | POA: Diagnosis not present

## 2021-09-18 DIAGNOSIS — R030 Elevated blood-pressure reading, without diagnosis of hypertension: Secondary | ICD-10-CM | POA: Diagnosis not present

## 2021-09-19 ENCOUNTER — Encounter: Payer: Self-pay | Admitting: Internal Medicine

## 2021-11-26 ENCOUNTER — Ambulatory Visit: Payer: BLUE CROSS/BLUE SHIELD | Admitting: Internal Medicine

## 2021-11-26 ENCOUNTER — Encounter: Payer: Self-pay | Admitting: Internal Medicine

## 2021-11-26 VITALS — BP 128/70 | HR 69 | Ht 61.5 in | Wt 152.0 lb

## 2021-11-26 DIAGNOSIS — K5900 Constipation, unspecified: Secondary | ICD-10-CM | POA: Diagnosis not present

## 2021-11-26 DIAGNOSIS — Z1211 Encounter for screening for malignant neoplasm of colon: Secondary | ICD-10-CM | POA: Diagnosis not present

## 2021-11-26 DIAGNOSIS — B356 Tinea cruris: Secondary | ICD-10-CM

## 2021-11-26 DIAGNOSIS — L290 Pruritus ani: Secondary | ICD-10-CM | POA: Diagnosis not present

## 2021-11-26 NOTE — Patient Instructions (Signed)
  If you are age 63 or older, your body mass index should be between 23-30. Your Body mass index is 28.26 kg/m. If this is out of the aforementioned range listed, please consider follow up with your Primary Care Provider.  If you are age 9 or younger, your body mass index should be between 19-25. Your Body mass index is 28.26 kg/m. If this is out of the aformentioned range listed, please consider follow up with your Primary Care Provider.   Start Miralax 17 grams daily. Use Nystatin ointment three times daily on perianal area and anus for three weeks. Use Destin daily for three weeks.  The [facility] providers would like to encourage you to use [facility] to communicate with providers for non-urgent requests or questions.  Due to long hold times on the telephone, sending your provider a message by [facility] may be a faster and more efficient way to get a response.  Please allow 48 business hours for a response.  Please remember that this is for non-urgent requests.   It was a pleasure to see you today!  Thank you for trusting me with your gastrointestinal care!

## 2021-11-26 NOTE — Progress Notes (Signed)
HPI: HPI: Crystal Ware is a 61 year old with a history of hemorrhoids, anal fissures, remote gastritis who is seen in consult at the request of Dr. Inda Merlin to discuss constipation, anal itching, anal fissures and to discuss colon cancer screening.  She is here alone today.  She reports that she has not had gastroenterology care since around 2013.  At that time she had an EGD for epigastric abdominal pain and was told she had gastritis.  She was told to stop cows milk and she has switched to soy milk.  This pain has not been a recurrent issue for her.  She also had a flexible sigmoidoscopy to evaluate rectal bleeding in 1991.  This was performed in [facility] and she was diagnosed with internal hemorrhoids.  She has never had a full colonoscopy.  She has been having issues with intermittent constipation.  She reports if she skips 2 days without having a bowel movement she will often have hard stool requiring straining.  With this she can see minor rectal bleeding.  She uses senna with good effect if she skips 2 days without bowel movement.  She uses this perhaps 2 days/week.  She does try to eat a high-fiber diet.  She has had perianal itching which can be intense and she is also noticed a red rash in her intergluteal cleft.  She is also had intermittent vaginal itching.  GYN treated her with Diflucan and then nystatin ointment.  She was told by GYN that she had "fissures".  The only real medications that she is using on a regular basis is valacyclovir and B12.  Past Medical History:  Diagnosis Date   Anal fissure    Anxiety    Cystitis     Past Surgical History:  Procedure Laterality Date   TOE SURGERY     TONSILLECTOMY      Outpatient Medications Prior to Visit  Medication Sig Dispense Refill   COVID-19 At Home Antigen Test (BINAXNOW COVID-19 AG HOME TEST) KIT binaxnow cov kit home tes     hydrochlorothiazide (HYDRODIURIL) 12.5 MG tablet      LORazepam (ATIVAN) 1 MG  tablet lorazepam 1 mg tablet     metoprolol tartrate (LOPRESSOR) 25 MG tablet      nitrofurantoin (MACRODANTIN) 50 MG capsule      progesterone (PROMETRIUM) 200 MG capsule      valACYclovir (VALTREX) 500 MG tablet Take by mouth as needed.     vitamin B-12 (CYANOCOBALAMIN) 100 MCG tablet Vitamin B-12  qd     ALPRAZolam (XANAX) 0.25 MG tablet 1/2 tablet every 8 hours (Patient not taking: Reported on 07/16/2021)     baclofen (LIORESAL) 20 MG tablet  (Patient not taking: Reported on 07/16/2021)     BINAXNOW COVID-19 AG HOME TEST KIT See admin instructions. follow package directions (Patient not taking: Reported on 07/16/2021)     celecoxib (CELEBREX) 200 MG capsule celecoxib 200 mg capsule (Patient not taking: Reported on 07/16/2021)     Cholecalciferol 25 MCG (1000 UT) tablet Take by mouth. (Patient not taking: Reported on 07/16/2021)     ciprofloxacin (CIPRO) 500 MG tablet Take 1 tablet (500 mg total) by mouth 2 (two) times daily. (Patient not taking: Reported on 07/16/2021) 20 tablet 0   clonazePAM (KLONOPIN) 1 MG tablet Take 1 tablet by mouth 2 (two) times daily as needed. (Patient not taking: Reported on 07/16/2021)     Diaper Rash Products (A+D DIAPER RASH) CREA  (Patient not taking: Reported on 07/16/2021)  diazepam (VALIUM) 5 MG tablet Take by mouth. (Patient not taking: Reported on 07/16/2021)     estradiol (ESTRACE) 0.1 MG/GM vaginal cream Estrace 0.01% (0.1 mg/gram) vaginal cream  Insert 0.5 applicatorsful 3 times a week by vaginal route. (Patient not taking: Reported on 07/16/2021)     No facility-administered medications prior to visit.    Allergies  Allergen Reactions   Hydrochlorothiazide Other (See Comments)    Cold, feet felt like blocks of ice Cold, feet felt like blocks of ice    Diphenhydramine     Family History  Problem Relation Age of Onset   Breast cancer Maternal Aunt    Heart disease Maternal Aunt    Atrial fibrillation Maternal Aunt    Colon cancer Neg Hx     Stomach cancer Neg Hx    Esophageal cancer Neg Hx    Colon polyps Neg Hx     Social History   Tobacco Use   Smoking status: Never   Smokeless tobacco: Never  Vaping Use   Vaping Use: Never used  Substance Use Topics   Alcohol use: Never   Drug use: Never    ROS: As per history of present illness, otherwise negative  BP 128/70   Pulse 69   Ht 5' 1.5" (1.562 m)   Wt 152 lb (68.9 kg)   SpO2 96%   BMI 28.26 kg/m  Constitutional: Well-developed and well-nourished. No distress. HEENT: Normocephalic and atraumatic.  No scleral icterus. Cardiovascular: Normal rate, regular rhythm and intact distal pulses. No M/R/G Pulmonary/chest: Effort normal and breath sounds normal. No wheezing, rales or rhonchi. Abdominal: Soft, nontender, nondistended. Bowel sounds active throughout. There are no masses palpable. No hepatosplenomegaly. Extremities: no clubbing, cyanosis, or edema Rectal: External exam only, small perianal skin tags, no anal rash though a area of erythematous skin in the intergluteal cleft consistent with tinea, DRE not performed today Neurological: Alert and oriented to person place and time. Skin: Skin is warm and dry.  Psychiatric: Normal mood and affect. Behavior is normal.   ASSESSMENT/PLAN: 61 year old with a history of hemorrhoids, anal fissures, remote gastritis who is seen in consult at the request of Dr. Inda Merlin to discuss constipation, anal itching, anal fissures and to discuss colon cancer screening.    History of internal hemorrhoids/perianal itching/history of anal fissures/perianal tinea --she had a myriad of questions today and also somewhat of a misunderstanding regarding fissures versus hemorrhoids versus perianal rash.  We discussed how each of these things separately or collectively can cause some of her symptoms.  I recommended the following: -- Nystatin ointment 3 times daily x2 weeks; after completing this therapy she can use creamy Desitin to the perianal  skin after showering to help as a barrier and a natural antifungal with the zinc component -- We can discuss possible future hemorrhoidal banding if symptoms persist  2.  Intermittent constipation --this exacerbates #1.  She is using senna but not daily.  I would recommend she try MiraLAX 17 g daily. -- Begin MiraLAX 17 g daily  3.  Colon cancer screening --she had questions today regarding FOBT, FIT, Cologuard and colonoscopy.  We discussed these at length.  She would really prefer colonoscopy.  After much discussion and initially scheduling this procedure she elected to not schedule at this time as she would like to arrange transportation.  We gave her information on [facility] which can help provide transportation for a fee.  Hopefully she will schedule her colonoscopy soon as this is certainly  overdue at age 51. -- Colonoscopy can be scheduled when patient is ready in the [place]  Over 90 minutes total spent today including patient facing time, coordination of care, reviewing medical history/procedures/pertinent radiology studies, and documentation of the encounter.   VI:PNYOX, Herbie [place], [place] [address] [place],  [place] [address]

## 2021-11-27 ENCOUNTER — Telehealth: Payer: Self-pay | Admitting: Internal Medicine

## 2021-11-27 NOTE — Telephone Encounter (Signed)
Called and spoke with patient about her follow up appointment, as she would like to have it rescheduled to October. I advised that Dr. Hilarie Fredrickson did not have any October appointments and that the appointments that were available was for hemorrhoid banding and triage/urgent appointment. I advised that she could call back and check for a cancellation. I also let her know that we just released our October [facility] schedule and that November would probably be released in a few weeks. I advisedc that we are currently trying to fill the [facility] slots that we currently have before releasing the month of November.

## 2021-11-27 NOTE — Telephone Encounter (Signed)
Patient misunderstood at appointment yesterday when scheduling her follow up appointment with Dr. Hilarie Fredrickson.  It was scheduled for the end of November; however, she needs her follow up appointment to be in October so she can schedule her colonoscopy for November.  I do not have any available appointments in October.  She said when she was here, she was shown a print out with several available appointments for October.  Can you please call her and arrange an October appointment for her follow up visit so she can schedule her colonoscopy for November?  Please and thank you.

## 2021-12-04 DIAGNOSIS — D225 Melanocytic nevi of trunk: Secondary | ICD-10-CM | POA: Diagnosis not present

## 2021-12-04 DIAGNOSIS — D2271 Melanocytic nevi of right lower limb, including hip: Secondary | ICD-10-CM | POA: Diagnosis not present

## 2021-12-04 DIAGNOSIS — L821 Other seborrheic keratosis: Secondary | ICD-10-CM | POA: Diagnosis not present

## 2021-12-04 DIAGNOSIS — D2261 Melanocytic nevi of right upper limb, including shoulder: Secondary | ICD-10-CM | POA: Diagnosis not present

## 2021-12-04 DIAGNOSIS — L570 Actinic keratosis: Secondary | ICD-10-CM | POA: Diagnosis not present

## 2021-12-17 DIAGNOSIS — R6882 Decreased libido: Secondary | ICD-10-CM | POA: Diagnosis not present

## 2021-12-17 DIAGNOSIS — Z124 Encounter for screening for malignant neoplasm of cervix: Secondary | ICD-10-CM | POA: Diagnosis not present

## 2021-12-17 DIAGNOSIS — Z01419 Encounter for gynecological examination (general) (routine) without abnormal findings: Secondary | ICD-10-CM | POA: Diagnosis not present

## 2021-12-17 DIAGNOSIS — N760 Acute vaginitis: Secondary | ICD-10-CM | POA: Diagnosis not present

## 2021-12-17 DIAGNOSIS — Z6828 Body mass index (BMI) 28.0-28.9, adult: Secondary | ICD-10-CM | POA: Diagnosis not present

## 2021-12-25 DIAGNOSIS — E559 Vitamin D deficiency, unspecified: Secondary | ICD-10-CM | POA: Diagnosis not present

## 2021-12-25 DIAGNOSIS — R350 Frequency of micturition: Secondary | ICD-10-CM | POA: Diagnosis not present

## 2021-12-25 DIAGNOSIS — Z658 Other specified problems related to psychosocial circumstances: Secondary | ICD-10-CM | POA: Diagnosis not present

## 2021-12-25 DIAGNOSIS — R030 Elevated blood-pressure reading, without diagnosis of hypertension: Secondary | ICD-10-CM | POA: Diagnosis not present

## 2022-01-03 ENCOUNTER — Telehealth: Payer: Self-pay | Admitting: Internal Medicine

## 2022-01-03 ENCOUNTER — Telehealth: Payer: Self-pay

## 2022-01-03 NOTE — Telephone Encounter (Signed)
Ok to schedule.

## 2022-01-03 NOTE — Telephone Encounter (Signed)
Pt has a follow up visit scheduled for 11/27. She wants to go ahead and schedule a colonoscopy because she is afraid all the slots will be gone. Please advise if ok to go ahead with scheduling Colon.

## 2022-01-03 NOTE — Telephone Encounter (Signed)
See below, ok per Dr. Hilarie Fredrickson to go ahead and schedule colonoscopy.

## 2022-02-24 ENCOUNTER — Ambulatory Visit: Payer: BLUE CROSS/BLUE SHIELD | Admitting: Internal Medicine

## 2022-02-24 ENCOUNTER — Encounter: Payer: Self-pay | Admitting: Internal Medicine

## 2022-02-24 VITALS — BP 128/78 | HR 79 | Ht 61.5 in | Wt 152.0 lb

## 2022-02-24 DIAGNOSIS — L290 Pruritus ani: Secondary | ICD-10-CM | POA: Diagnosis not present

## 2022-02-24 DIAGNOSIS — K5900 Constipation, unspecified: Secondary | ICD-10-CM

## 2022-02-24 DIAGNOSIS — Z1211 Encounter for screening for malignant neoplasm of colon: Secondary | ICD-10-CM | POA: Diagnosis not present

## 2022-02-24 MED ORDER — METOCLOPRAMIDE HCL 10 MG PO TABS: 10.0000 mg | ORAL_TABLET | ORAL | 0 refills | Status: DC

## 2022-02-24 NOTE — Progress Notes (Signed)
   Subjective:    Patient ID: Crystal Ware, female    DOB: Aug 30, 1960, 61 y.o.   MRN: [id number]  HPI Crystal Ware is a 61 year old female with a history of hemorrhoids, anal fissures, constipation and perianal itching who is here for follow-up.  She was initially seen on 11/26/2021.  At last visit she was placed on nystatin cream for perianal itching.  MiraLAX was recommended and colonoscopy was discussed but not scheduled.  Since seeing me she saw gynecology.  She continues to have significant perianal and perineal itching.  She reports the itching is been so severe that she has had some bleeding and is worried about a secondary bacterial infection.  Gynecology gave her metronidazole gel but she has not started this yet.  She also found it difficult to use nystatin on a regular basis.  She has not tried it consistently.  Gynecology recommended changing the nystatin to nystatin plus steroid.  Perianal and perineal itching continues to be her biggest complaint.  Mild intermittent issues with constipation.   Review of Systems As per HPI, otherwise negative  Current Medications, Allergies, Past Medical History, Past Surgical History, Family History and Social History were reviewed in [employer] record.    Objective:   Physical Exam BP 128/78   Pulse 79   Ht 5' 1.5" (1.562 m)   Wt 152 lb (68.9 kg)   SpO2 94%   BMI 28.26 kg/m  Gen: awake, alert, NAD     Assessment & Plan:  61 year old female with a history of hemorrhoids, anal fissures, constipation and perianal itching who is here for follow-up.   Perianal and perineal itching with rash/history of anal fissure --she has not been consistent enough with antifungal therapy to see significant improvement.  She is also been prescribed antifungal plus steroid cream as well as metronidazole gel but has not started the latter yet. -- I have asked her to be consistent with previously prescribed nystatin plus steroid cream in  addition to metronidazole gel.  Metronidazole gel will only be for 5 to 7 days.  She should continue the nystatin plus steroid until symptoms have resolved +5 additional days -- Colonoscopy recommended to rule out internal process leading to external symptoms -- Also recommended she get in touch with her dermatologist, Crystal Ware, given that the preponderance of her symptoms are on her skin.  Rule out other conditions including reverse psoriasis  2.  Colon cancer screening --she is thought about this and wishes to perform colonoscopy.  She is worried about propofol sedation and asks if moderate sedation will be an issue. -- Colonoscopy with MiraLAX prep; metoclopramide 10 mg 30 minutes before each half of the prep -- Propofol versus moderate sedation, both options available on day of procedure  3.  Intermittent constipation --she can use over-the-counter senna or MiraLAX per over-the-counter product instruction  35 minutes total spent today including patient facing time, coordination of care, reviewing medical history/procedures/pertinent radiology studies, and documentation of the encounter.

## 2022-02-24 NOTE — Patient Instructions (Addendum)
Please use the metrogel and nystatin creams that have been given to you for rectal itching and rash. ______________________________________________________  We have sent the following medications to your pharmacy for you to pick up at your convenience: Reglan 10 mg _______________________________________________________  You have been scheduled for a colonoscopy. Please follow written instructions given to you at your visit today.  Please pick up your prep supplies at the pharmacy within the next 1-3 days. If you use inhalers (even only as needed), please bring them with you on the day of your procedure.  _______________________________________________________  If you are age 46 or older, your body mass index should be between 23-30. Your Body mass index is 28.26 kg/m. If this is out of the aforementioned range listed, please consider follow up with your Primary Care Provider.  If you are age 63 or younger, your body mass index should be between 19-25. Your Body mass index is 28.26 kg/m. If this is out of the aformentioned range listed, please consider follow up with your Primary Care Provider.   ________________________________________________________  The [facility] providers would like to encourage you to use [facility] to communicate with providers for non-urgent requests or questions.  Due to long hold times on the telephone, sending your provider a message by [facility] may be a faster and more efficient way to get a response.  Please allow 48 business hours for a response.  Please remember that this is for non-urgent requests.  _______________________________________________________  Due to recent changes in healthcare laws, you may see the results of your imaging and laboratory studies on MyChart before your provider has had a chance to review them.  We understand that in some cases there may be results that are confusing or concerning to you. Not all laboratory results come back in the  same time frame and the provider may be waiting for multiple results in order to interpret others.  Please give [place] 48 hours in order for your provider to thoroughly review all the results before contacting the office for clarification of your results.

## 2022-03-02 ENCOUNTER — Encounter: Payer: Self-pay | Admitting: Internal Medicine

## 2022-03-04 DIAGNOSIS — M545 Low back pain, unspecified: Secondary | ICD-10-CM | POA: Diagnosis not present

## 2022-03-06 ENCOUNTER — Other Ambulatory Visit: Payer: Self-pay | Admitting: Internal Medicine

## 2022-03-06 DIAGNOSIS — Z1231 Encounter for screening mammogram for malignant neoplasm of breast: Secondary | ICD-10-CM

## 2022-03-07 ENCOUNTER — Other Ambulatory Visit: Payer: Self-pay | Admitting: Obstetrics and Gynecology

## 2022-03-07 DIAGNOSIS — Z1231 Encounter for screening mammogram for malignant neoplasm of breast: Secondary | ICD-10-CM

## 2022-03-10 DIAGNOSIS — M79642 Pain in left hand: Secondary | ICD-10-CM | POA: Diagnosis not present

## 2022-03-10 DIAGNOSIS — M79641 Pain in right hand: Secondary | ICD-10-CM | POA: Diagnosis not present

## 2022-03-10 DIAGNOSIS — M7521 Bicipital tendinitis, right shoulder: Secondary | ICD-10-CM | POA: Diagnosis not present

## 2022-03-10 DIAGNOSIS — M67441 Ganglion, right hand: Secondary | ICD-10-CM | POA: Diagnosis not present

## 2022-03-10 DIAGNOSIS — M25521 Pain in right elbow: Secondary | ICD-10-CM | POA: Diagnosis not present

## 2022-03-12 ENCOUNTER — Ambulatory Visit
Admission: RE | Admit: 2022-03-12 | Discharge: 2022-03-12 | Disposition: A | Discharge status: Home / Self Care | Payer: BLUE CROSS/BLUE SHIELD | Source: Ambulatory Visit | Attending: Internal Medicine | Admitting: Internal Medicine

## 2022-03-12 ENCOUNTER — Ambulatory Visit: Payer: BLUE CROSS/BLUE SHIELD

## 2022-03-12 DIAGNOSIS — Z1231 Encounter for screening mammogram for malignant neoplasm of breast: Secondary | ICD-10-CM

## 2022-03-18 ENCOUNTER — Encounter: Payer: Self-pay | Admitting: Internal Medicine

## 2022-03-18 ENCOUNTER — Ambulatory Visit (AMBULATORY_SURGERY_CENTER): Payer: BLUE CROSS/BLUE SHIELD | Admitting: Internal Medicine

## 2022-03-18 VITALS — BP 129/79 | HR 83 | Temp 97.8°F | Resp 14 | Ht 61.0 in | Wt 152.0 lb

## 2022-03-18 DIAGNOSIS — D123 Benign neoplasm of transverse colon: Secondary | ICD-10-CM

## 2022-03-18 DIAGNOSIS — Z1211 Encounter for screening for malignant neoplasm of colon: Secondary | ICD-10-CM | POA: Diagnosis not present

## 2022-03-18 DIAGNOSIS — K635 Polyp of colon: Secondary | ICD-10-CM | POA: Diagnosis not present

## 2022-03-18 MED ORDER — SODIUM CHLORIDE 0.9 % IV SOLN: 500.0000 mL | Freq: Once | INTRAVENOUS | Status: DC

## 2022-03-18 NOTE — Progress Notes (Signed)
Called to room to assist during endoscopic procedure.  Patient ID and intended procedure confirmed with present staff. Received instructions for my participation in the procedure from the performing physician.  

## 2022-03-18 NOTE — Progress Notes (Signed)
See office visit dated 02/24/2022 for details and current H&P Patient presenting for colonoscopy today for screening  She remains appropriate for [place] colonoscopy today

## 2022-03-18 NOTE — Progress Notes (Signed)
Vss nad trans to pacu °

## 2022-03-18 NOTE — Patient Instructions (Signed)
Await pathology results.  Handout on polyps and hemorrhoids provided.  YOU HAD AN ENDOSCOPIC PROCEDURE TODAY AT [facility]:   Refer to the procedure report that was given to you for any specific questions about what was found during the examination.  If the procedure report does not answer your questions, please call your gastroenterologist to clarify.  If you requested that your care partner not be given the details of your procedure findings, then the procedure report has been included in a sealed envelope for you to review at your convenience later.  YOU SHOULD EXPECT: Some feelings of bloating in the abdomen. Passage of more gas than usual.  Walking can help get rid of the air that was put into your GI tract during the procedure and reduce the bloating. If you had a lower endoscopy (such as a colonoscopy or flexible sigmoidoscopy) you may notice spotting of blood in your stool or on the toilet paper. If you underwent a bowel prep for your procedure, you may not have a normal bowel movement for a few days.  Please Note:  You might notice some irritation and congestion in your nose or some drainage.  This is from the oxygen used during your procedure.  There is no need for concern and it should clear up in a day or so.  SYMPTOMS TO REPORT IMMEDIATELY:  Following lower endoscopy (colonoscopy or flexible sigmoidoscopy):  Excessive amounts of blood in the stool  Significant tenderness or worsening of abdominal pains  Swelling of the abdomen that is new, acute  Fever of 100F or higher   For urgent or emergent issues, a gastroenterologist can be reached at any hour by calling [phone]. Do not use MyChart messaging for urgent concerns.    DIET:  We do recommend a small meal at first, but then you may proceed to your regular diet.  Drink plenty of fluids but you should avoid alcoholic beverages for 24 hours.  ACTIVITY:  You should plan to take it easy for the rest of  today and you should NOT DRIVE or use heavy machinery until tomorrow (because of the sedation medicines used during the test).    FOLLOW UP: Our staff will call the number listed on your records the next business day following your procedure.  We will call around 7:15- 8:00 am to check on you and address any questions or concerns that you may have regarding the information given to you following your procedure. If we do not reach you, we will leave a message.     If any biopsies were taken you will be contacted by phone or by letter within the next 1-3 weeks.  Please call us at [phone] if you have not heard about the biopsies in 3 weeks.    SIGNATURES/CONFIDENTIALITY: You and/or your care partner have signed paperwork which will be entered into your electronic medical record.  These signatures attest to the fact that that the information above on your After Visit Summary has been reviewed and is understood.  Full responsibility of the confidentiality of this discharge information lies with you and/or your care-partner.

## 2022-03-18 NOTE — Op Note (Signed)
[facility] Patient Name: Crystal Ware Procedure Date: 03/18/2022 9:27 AM MRN: [id number] Endoscopist: Jerene Bears , MD, [id number] Age: 61 Referring MD:  Date of Birth: 1960/12/10 Gender: Female Account #: [id number] Procedure:                Colonoscopy Indications:              Screening for colorectal malignant neoplasm Medicines:                Midazolam 6 mg IV, Fentanyl 100 micrograms IV Procedure:                Pre-Anesthesia Assessment:                           - Prior to the procedure, a History and Physical                            was performed, and patient medications and                            allergies were reviewed. The patient's tolerance of                            previous anesthesia was also reviewed. The risks                            and benefits of the procedure and the sedation                            options and risks were discussed with the patient.                            All questions were answered, and informed consent                            was obtained. Prior Anticoagulants: The patient has                            taken no anticoagulant or antiplatelet agents. ASA                            Grade Assessment: II - A patient with mild systemic                            disease. After reviewing the risks and benefits,                            the patient was deemed in satisfactory condition to                            undergo the procedure.                           After obtaining informed consent, the colonoscope  was passed under direct vision. Throughout the                            procedure, the patient's blood pressure, pulse, and                            oxygen saturations were monitored continuously. The                            PCF-HQ190L Colonoscope was introduced through the                            anus and advanced to the cecum. The colonoscopy was                             performed without difficulty. The patient tolerated                            the procedure well. The quality of the bowel                            preparation was good. The ileocecal valve,                            appendiceal orifice, and rectum were photographed. Scope In: 9:44:50 AM Scope Out: 10:01:38 AM Scope Withdrawal Time: 0 hours 12 minutes 43 seconds  Total Procedure Duration: 0 hours 16 minutes 48 seconds  Findings:                 The perianal exam findings include a perianal                            fungal rash (improved) in the intragluteal cleft.                           A 6 mm polyp was found in the proximal transverse                            colon. The polyp was sessile. The polyp was removed                            with a cold snare. Resection and retrieval were                            complete.                           Internal hemorrhoids were found during                            retroflexion. The hemorrhoids were small.                           The exam was otherwise without abnormality. Complications:  No immediate complications. Estimated Blood Loss:     Estimated blood loss: none. Impression:               - Fungal rash found on perianal exam in                            intragluteal cleft (improved but not resolved).                            Healed anal fissure.                           - One 6 mm polyp in the proximal transverse colon,                            removed with a cold snare. Resected and retrieved.                           - Small internal hemorrhoids and hypertrophied anal                            papillae.                           - The examination was otherwise normal. Recommendation:           - Patient has a contact number available for                            emergencies. The signs and symptoms of potential                            delayed complications were discussed with the                             patient. Return to normal activities tomorrow.                            Written discharge instructions were provided to the                            patient.                           - Resume previous diet.                           - Continue present medications.                           - Await pathology results.                           - Repeat colonoscopy is recommended for                            surveillance. The colonoscopy date will  be                            determined after pathology results from today's                            exam become available for review. Jerene Bears, MD 03/18/2022 10:06:26 AM This report has been signed electronically.

## 2022-03-19 ENCOUNTER — Telehealth: Payer: Self-pay | Admitting: *Deleted

## 2022-03-19 NOTE — Telephone Encounter (Signed)
  Follow up Call-     03/18/2022    8:42 AM  Call back number  Post procedure Call Back phone  # [phone]-  Permission to leave phone message Yes     Patient questions:  Do you have a fever, pain , or abdominal swelling? No. Pain Score  0 *  Have you tolerated food without any problems? Yes.    Have you been able to return to your normal activities? Yes.    Do you have any questions about your discharge instructions: Diet   No. Medications  No. Follow up visit  No.  Do you have questions or concerns about your Care? No.  Actions: * If pain score is 4 or above: No action needed, pain <4.

## 2022-03-26 ENCOUNTER — Encounter: Payer: Self-pay | Admitting: Internal Medicine

## 2022-03-27 IMAGING — MR MR LUMBAR SPINE W/O CM
4 of 5 series · 19 of 48 positions shown · non-contrast
Comparison: None.

CLINICAL DATA: Low back pain

EXAM:
MRI LUMBAR SPINE WITHOUT CONTRAST
TECHNIQUE: Multiplanar, multisequence MR imaging of the lumbar spine was
performed. No intravenous contrast was administered.

[Series 2: T2 · sagittal · 4.0mm · 0.55mm/px · 6 of 15 slices shown (1 of 2)]
[im 1/15]
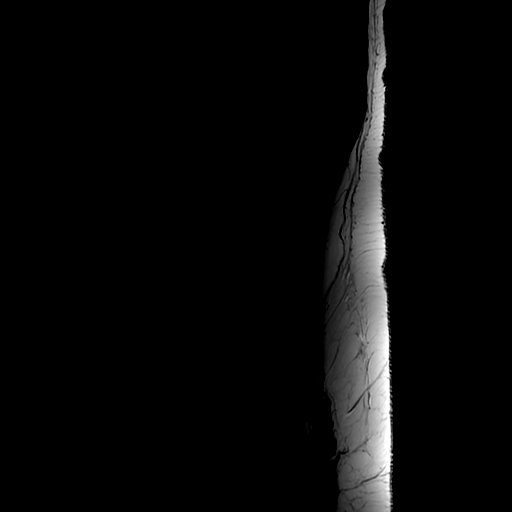
[im 3/15]
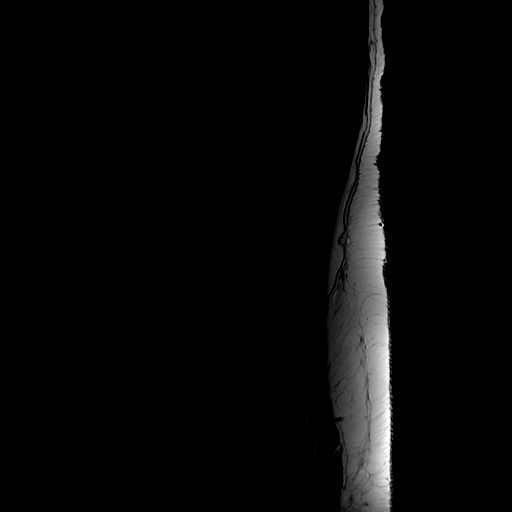
[im 6/15]
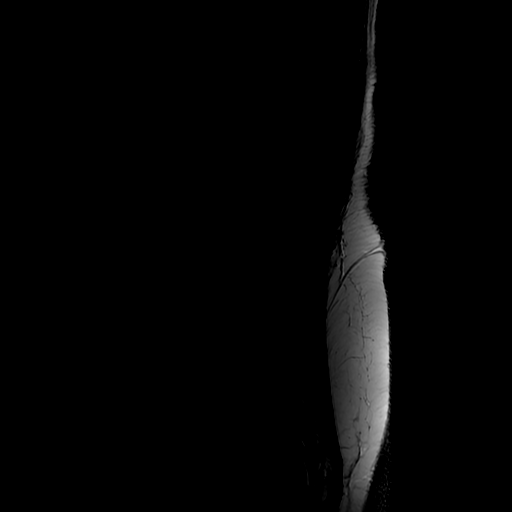
[im 9/15]
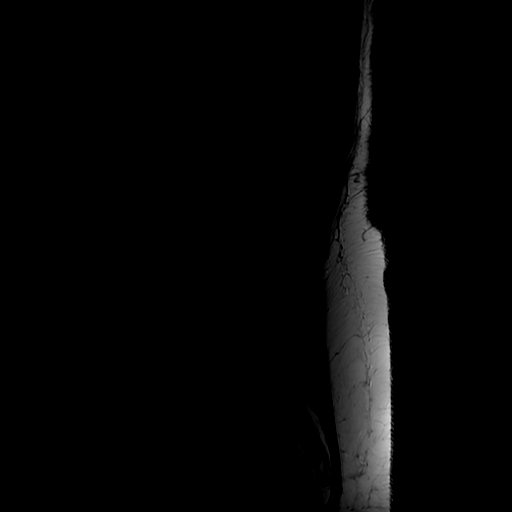
[im 12/15]
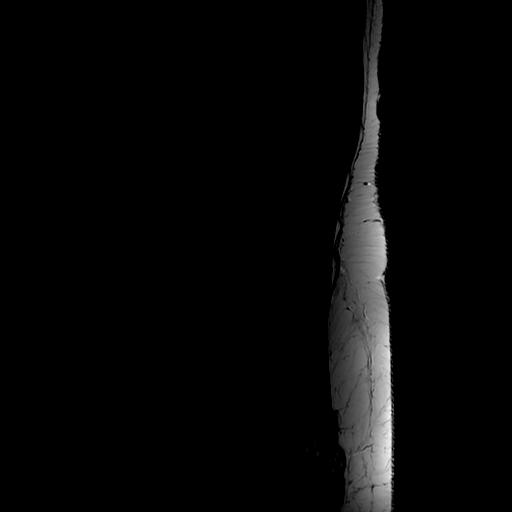
[im 15/15]
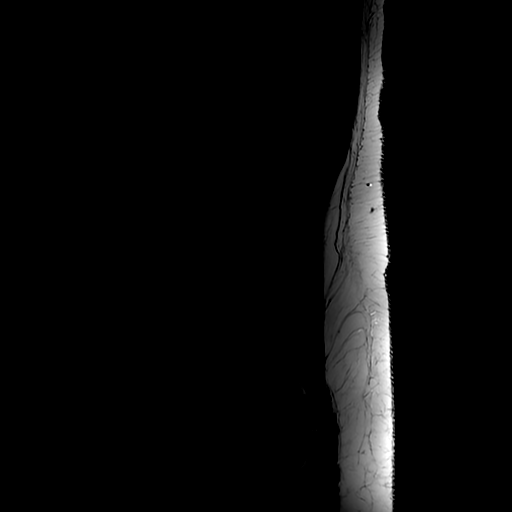

[Series 4: T1 · sagittal · 4.0mm · 0.55mm/px · 3 of 15 slices shown (1 of 2)]
[im 3/15]
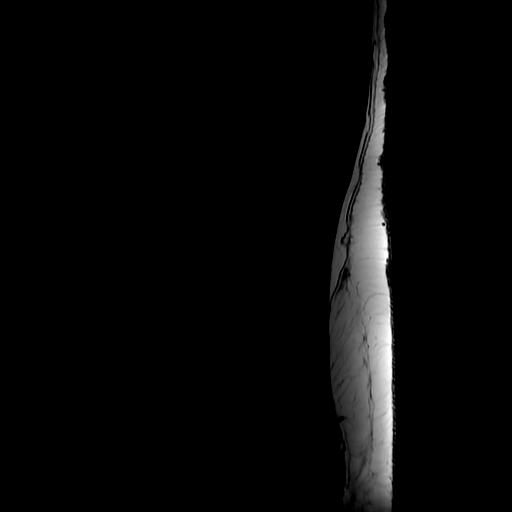
[im 9/15]
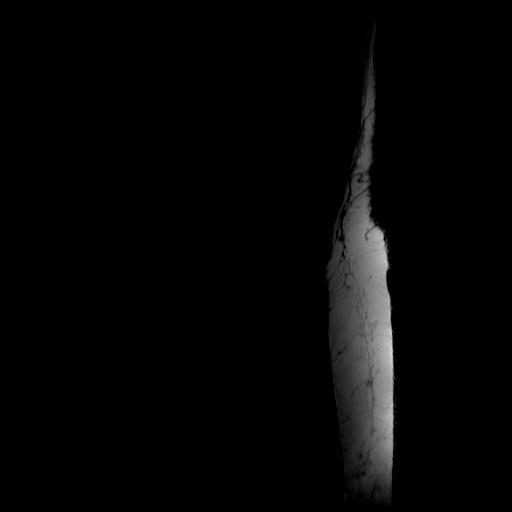
[im 15/15]
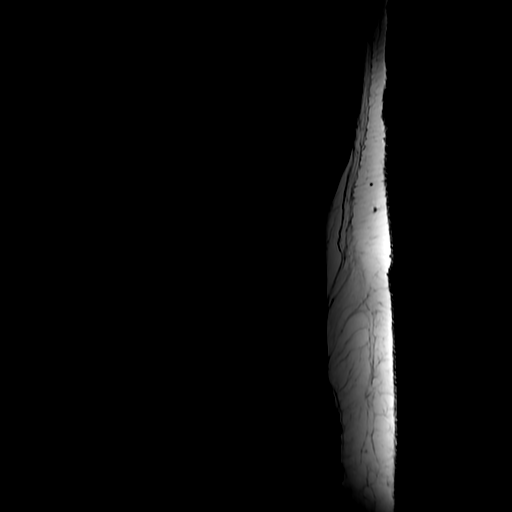

[Series 5: T2 · axial · 4.0mm · 0.39mm/px · z∈[-87,+78]mm · 7 of 40 slices shown (2 of 2)]
[im 1/40]
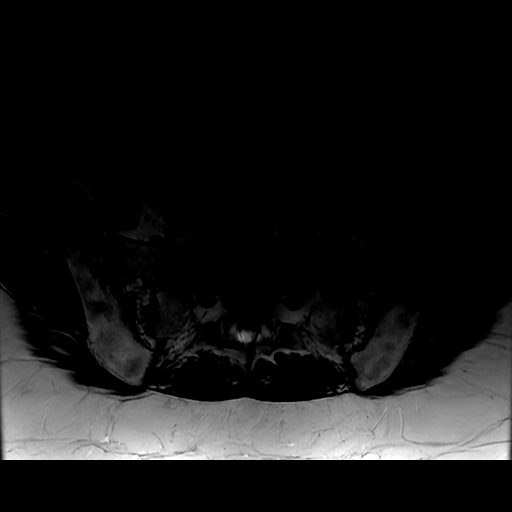
[im 6/40]
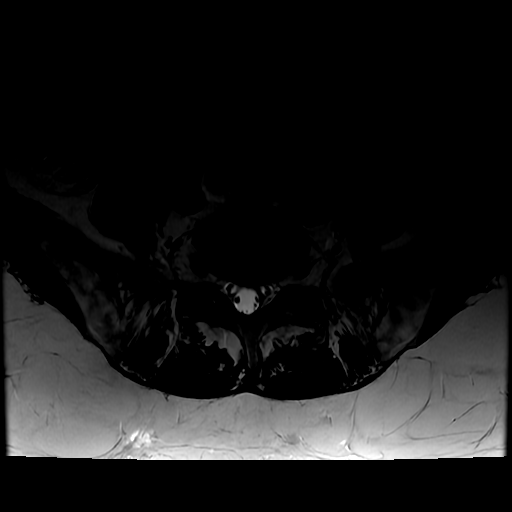
[im 12/40]
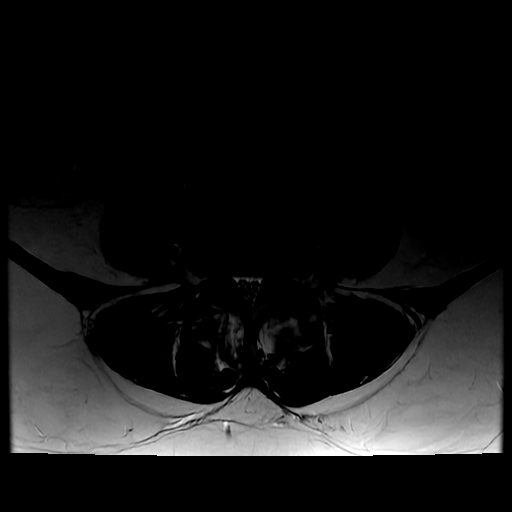
[im 17/40]
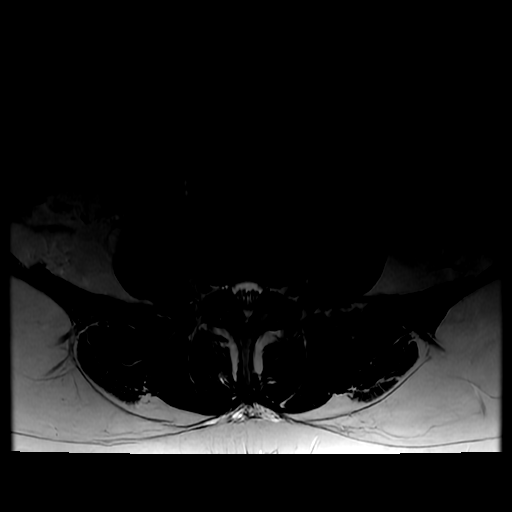
[im 20/40]
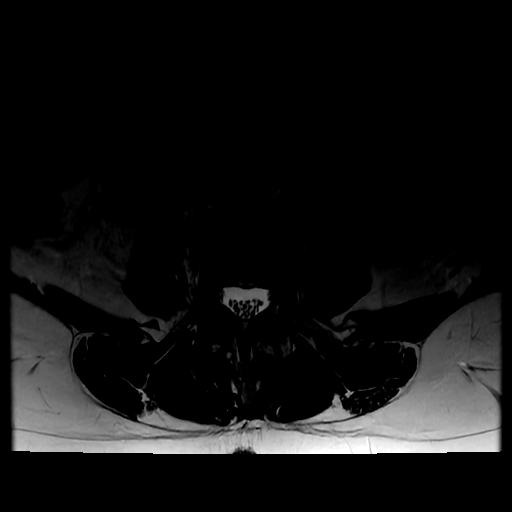
[im 23/40]
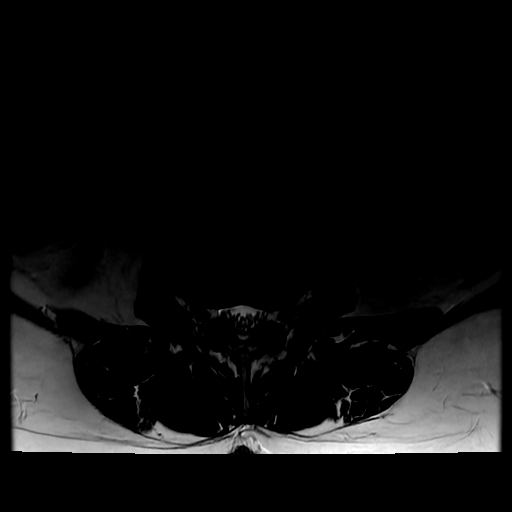
[im 34/40]
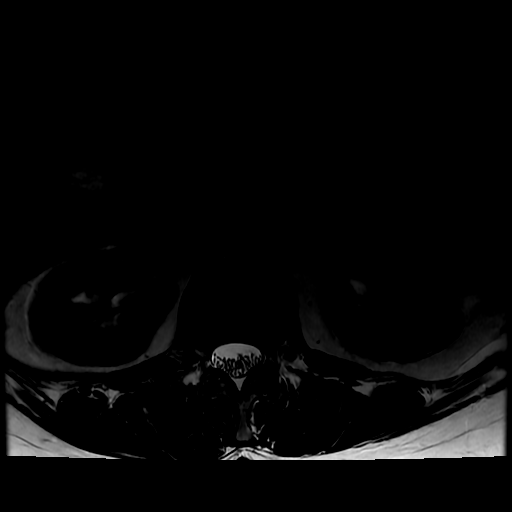

[Series 6: T1 · axial · 4.0mm · 0.39mm/px · z∈[-62,+78]mm · 3 of 40 slices shown (2 of 2)]
[im 6/40]
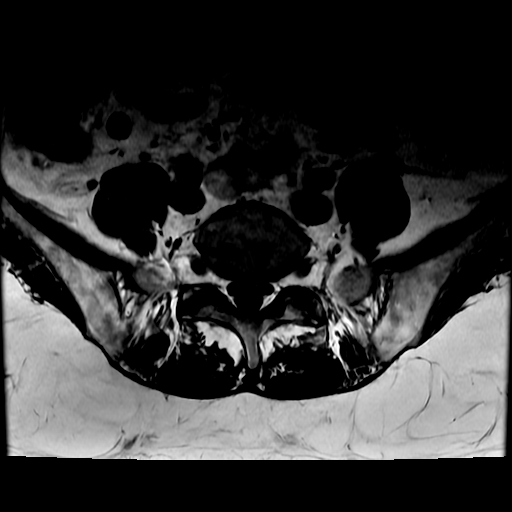
[im 20/40]
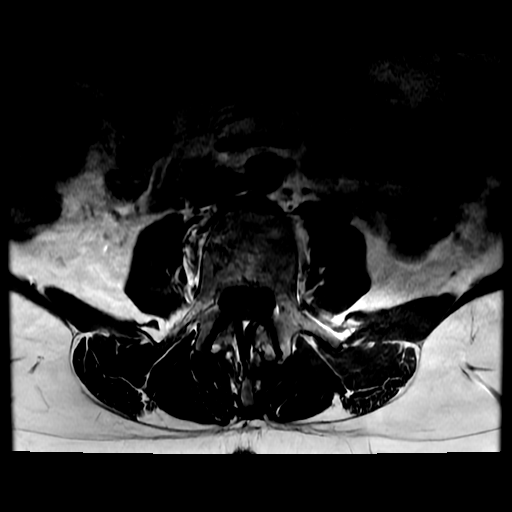
[im 34/40]
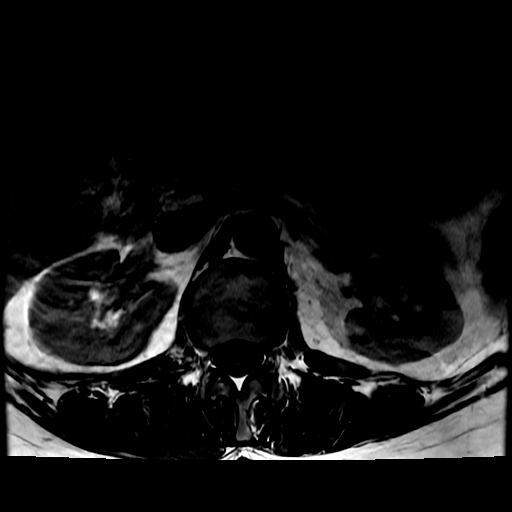

[19 of 48 positions shown; findings below may reference images not displayed]

FINDINGS: Segmentation:  Standard.

Alignment:  3 mm grade 1 anterolisthesis L4 on L5.

Vertebrae: Mild reactive marrow edema associated with the bilateral
L4-5 facet joints. No evidence of fracture. No evidence of discitis.
No suspicious marrow replacing bone lesion.

Conus medullaris and cauda equina: Conus extends to the T12-L1
level. Conus and cauda equina appear normal.

Paraspinal and other soft tissues: Negative.

Disc levels:

T12-L1: No significant disc protrusion, foraminal stenosis, or canal
stenosis.

L1-L2: Minimal annular disc bulge. Unremarkable facet joints. No
foraminal or canal stenosis.

L2-L3: Mild annular disc bulge with small biforaminal protrusions.
Mild bilateral facet arthropathy. Borderline-mild canal stenosis. No
significant foraminal stenosis.

L3-L4: Mild annular disc bulge with mild-moderate bilateral facet
arthropathy. Borderline-mild canal stenosis. Mild right foraminal
stenosis.

L4-L5: Anterolisthesis with disc uncovering and diffuse disc bulge.
Moderate-severe bilateral facet arthropathy with ligamentum flavum
buckling. Findings result in moderate canal stenosis with moderate
bilateral foraminal stenosis, right worse than left.

L5-S1: No disc protrusion. Moderate bilateral facet arthropathy. No
foraminal or canal stenosis.
IMPRESSION: 1. Multilevel lumbar spondylosis, most pronounced at the L4-5 level
where there is moderate canal stenosis and moderate bilateral
foraminal stenosis, right worse than left.
2. Reactive marrow edema associated with the bilateral L4-5 facet
joints, which can be a source of back pain.

## 2022-04-11 ENCOUNTER — Encounter: Payer: Self-pay | Admitting: Obstetrics and Gynecology

## 2022-05-12 ENCOUNTER — Other Ambulatory Visit: Payer: Self-pay | Admitting: Obstetrics and Gynecology

## 2022-05-12 DIAGNOSIS — Z1231 Encounter for screening mammogram for malignant neoplasm of breast: Secondary | ICD-10-CM

## 2022-05-22 DIAGNOSIS — R300 Dysuria: Secondary | ICD-10-CM | POA: Diagnosis not present

## 2022-05-22 DIAGNOSIS — R319 Hematuria, unspecified: Secondary | ICD-10-CM | POA: Diagnosis not present

## 2022-05-22 DIAGNOSIS — N760 Acute vaginitis: Secondary | ICD-10-CM | POA: Diagnosis not present

## 2022-05-22 DIAGNOSIS — B372 Candidiasis of skin and nail: Secondary | ICD-10-CM | POA: Diagnosis not present

## 2022-06-16 DIAGNOSIS — N3946 Mixed incontinence: Secondary | ICD-10-CM | POA: Diagnosis not present

## 2022-06-16 DIAGNOSIS — N3010 Interstitial cystitis (chronic) without hematuria: Secondary | ICD-10-CM | POA: Diagnosis not present

## 2022-06-16 DIAGNOSIS — Z87440 Personal history of urinary (tract) infections: Secondary | ICD-10-CM | POA: Diagnosis not present

## 2022-06-20 NOTE — Telephone Encounter (Signed)
error 

## 2022-07-24 DIAGNOSIS — L659 Nonscarring hair loss, unspecified: Secondary | ICD-10-CM | POA: Diagnosis not present

## 2022-07-24 DIAGNOSIS — E559 Vitamin D deficiency, unspecified: Secondary | ICD-10-CM | POA: Diagnosis not present

## 2022-07-24 DIAGNOSIS — I10 Essential (primary) hypertension: Secondary | ICD-10-CM | POA: Diagnosis not present

## 2022-07-24 DIAGNOSIS — Z658 Other specified problems related to psychosocial circumstances: Secondary | ICD-10-CM | POA: Diagnosis not present

## 2022-07-24 DIAGNOSIS — Z13220 Encounter for screening for lipoid disorders: Secondary | ICD-10-CM | POA: Diagnosis not present

## 2022-08-12 ENCOUNTER — Ambulatory Visit: Payer: BLUE CROSS/BLUE SHIELD | Admitting: Internal Medicine

## 2022-09-02 ENCOUNTER — Ambulatory Visit (INDEPENDENT_AMBULATORY_CARE_PROVIDER_SITE_OTHER): Payer: BLUE CROSS/BLUE SHIELD | Admitting: Internal Medicine

## 2022-09-02 ENCOUNTER — Encounter: Payer: Self-pay | Admitting: Internal Medicine

## 2022-09-02 VITALS — BP 122/74 | HR 72 | Temp 98.4°F | Ht 61.0 in | Wt 145.0 lb

## 2022-09-02 DIAGNOSIS — R030 Elevated blood-pressure reading, without diagnosis of hypertension: Secondary | ICD-10-CM

## 2022-09-02 DIAGNOSIS — F418 Other specified anxiety disorders: Secondary | ICD-10-CM

## 2022-09-02 DIAGNOSIS — M6289 Other specified disorders of muscle: Secondary | ICD-10-CM | POA: Diagnosis not present

## 2022-09-02 DIAGNOSIS — N3010 Interstitial cystitis (chronic) without hematuria: Secondary | ICD-10-CM

## 2022-09-02 NOTE — Patient Instructions (Signed)
We will get the records and make sure you are up to date.

## 2022-09-02 NOTE — Progress Notes (Unsigned)
   Subjective:   Patient ID: Crystal Ware, female    DOB: 1960/08/12, 62 y.o.   MRN: [id number]  HPI The patient is a new 62 YO female coming in for ongoing care see A/P for details  PMH, [facility], social history reviewed and updated  Review of Systems  Constitutional: Negative.   HENT: Negative.    Eyes: Negative.   Respiratory:  Negative for cough, chest tightness and shortness of breath.   Cardiovascular:  Negative for chest pain, palpitations and leg swelling.  Gastrointestinal:  Negative for abdominal distention, abdominal pain, constipation, diarrhea, nausea and vomiting.  Musculoskeletal: Negative.   Skin: Negative.   Neurological: Negative.   Psychiatric/Behavioral: Negative.      Objective:  Physical Exam Constitutional:      Appearance: She is well-developed.  HENT:     Head: Normocephalic and atraumatic.  Cardiovascular:     Rate and Rhythm: Normal rate and regular rhythm.  Pulmonary:     Effort: Pulmonary effort is normal. No respiratory distress.     Breath sounds: Normal breath sounds. No wheezing or rales.  Abdominal:     General: Bowel sounds are normal. There is no distension.     Palpations: Abdomen is soft.     Tenderness: There is no abdominal tenderness. There is no rebound.  Musculoskeletal:     Cervical back: Normal range of motion.  Skin:    General: Skin is warm and dry.  Neurological:     Mental Status: She is alert and oriented to person, place, and time.     Coordination: Coordination normal.     Vitals:   09/02/22 1258  BP: 122/74  Pulse: 72  Temp: 98.4 F (36.9 C)  TempSrc: Oral  SpO2: 96%  Weight: 145 lb (65.8 kg)  Height: 5\' 1"  (1.549 m)    Assessment & Plan:  Visit time 30 minutes in face to face communication with patient and coordination of care, additional 15 minutes spent in record review, coordination or care, ordering tests, communicating/referring to other healthcare professionals, documenting in medical records all on the  same day of the visit for total time 45 minutes spent on the visit.

## 2022-09-04 NOTE — Assessment & Plan Note (Signed)
She is seeing urology and unclear diagnosis. She is taking nitrofurantoin as needed and is going to have urodynamics study in the near future to help clarify.

## 2022-09-04 NOTE — Assessment & Plan Note (Signed)
Has had significant stress in the past few years and this has greatly impacted her health.

## 2022-09-04 NOTE — Assessment & Plan Note (Signed)
Previously on BP medication and has come off due to less current stress. She has metoprolol on hand for prn for tachycardia.

## 2022-09-04 NOTE — Assessment & Plan Note (Signed)
This is a struggle for her and worse with stress. Uses baclofen as needed for this.

## 2022-09-23 ENCOUNTER — Telehealth: Payer: Self-pay | Admitting: *Deleted

## 2022-09-23 NOTE — Telephone Encounter (Signed)
Faxed orders signed by Dr. Al Pimple to [facility] in [facility] Fax confirmation received

## 2022-11-07 ENCOUNTER — Telehealth: Payer: Self-pay | Admitting: Podiatry

## 2022-11-07 NOTE — Telephone Encounter (Signed)
Patient would like to know if its possible to get the prescription ointment gentamicin sulfate 0.1 % 15gm tube the first available appointment for existing patient in September and she doesn't want to wait that long she also wants you to discuss another issue had to make an apt in September really wants a refill on ointment. Contact patient and advise.

## 2022-11-08 ENCOUNTER — Other Ambulatory Visit: Payer: Self-pay | Admitting: Podiatry

## 2022-11-08 MED ORDER — GENTAMICIN SULFATE 0.1 % EX CREA: 1.0000 | TOPICAL_CREAM | Freq: Three times a day (TID) | CUTANEOUS | 2 refills | Status: DC

## 2022-11-13 ENCOUNTER — Other Ambulatory Visit: Payer: Self-pay | Admitting: Podiatry

## 2022-11-13 ENCOUNTER — Encounter (INDEPENDENT_AMBULATORY_CARE_PROVIDER_SITE_OTHER): Payer: Self-pay

## 2022-11-13 DIAGNOSIS — L853 Xerosis cutis: Secondary | ICD-10-CM

## 2022-11-13 MED ORDER — GENTAMICIN SULFATE 0.1 % EX OINT
1.0000 | TOPICAL_OINTMENT | Freq: Three times a day (TID) | CUTANEOUS | 3 refills | Status: AC
Start: 2022-11-13 — End: ?

## 2022-11-13 NOTE — Progress Notes (Signed)
Patient called about her Rx gentamicin cream.  She got the cream, but was supposed to get the ointment.  D/C the cream Rx and did a new Rx for the ointment and sent in today.

## 2022-12-08 ENCOUNTER — Ambulatory Visit: Payer: BLUE CROSS/BLUE SHIELD | Admitting: Podiatry

## 2022-12-18 DIAGNOSIS — R350 Frequency of micturition: Secondary | ICD-10-CM | POA: Diagnosis not present

## 2022-12-18 DIAGNOSIS — L290 Pruritus ani: Secondary | ICD-10-CM | POA: Diagnosis not present

## 2022-12-18 DIAGNOSIS — N898 Other specified noninflammatory disorders of vagina: Secondary | ICD-10-CM | POA: Diagnosis not present

## 2022-12-23 ENCOUNTER — Encounter: Payer: BLUE CROSS/BLUE SHIELD | Admitting: Internal Medicine

## 2022-12-24 DIAGNOSIS — N760 Acute vaginitis: Secondary | ICD-10-CM | POA: Diagnosis not present

## 2022-12-24 DIAGNOSIS — Z6827 Body mass index (BMI) 27.0-27.9, adult: Secondary | ICD-10-CM | POA: Diagnosis not present

## 2022-12-24 DIAGNOSIS — Z01419 Encounter for gynecological examination (general) (routine) without abnormal findings: Secondary | ICD-10-CM | POA: Diagnosis not present

## 2022-12-29 DIAGNOSIS — M79672 Pain in left foot: Secondary | ICD-10-CM | POA: Diagnosis not present

## 2022-12-29 DIAGNOSIS — M2041 Other hammer toe(s) (acquired), right foot: Secondary | ICD-10-CM | POA: Diagnosis not present

## 2022-12-29 DIAGNOSIS — M2011 Hallux valgus (acquired), right foot: Secondary | ICD-10-CM | POA: Diagnosis not present

## 2022-12-29 DIAGNOSIS — M67471 Ganglion, right ankle and foot: Secondary | ICD-10-CM | POA: Diagnosis not present

## 2022-12-30 DIAGNOSIS — N3010 Interstitial cystitis (chronic) without hematuria: Secondary | ICD-10-CM | POA: Diagnosis not present

## 2023-01-05 ENCOUNTER — Ambulatory Visit: Payer: BLUE CROSS/BLUE SHIELD | Admitting: Podiatry

## 2023-01-13 ENCOUNTER — Encounter: Payer: BLUE CROSS/BLUE SHIELD | Admitting: Internal Medicine

## 2023-01-27 ENCOUNTER — Encounter: Payer: BLUE CROSS/BLUE SHIELD | Admitting: Internal Medicine

## 2023-02-18 DIAGNOSIS — R895 Abnormal microbiological findings in specimens from other organs, systems and tissues: Secondary | ICD-10-CM | POA: Diagnosis not present

## 2023-02-18 DIAGNOSIS — Z124 Encounter for screening for malignant neoplasm of cervix: Secondary | ICD-10-CM | POA: Diagnosis not present

## 2023-02-18 DIAGNOSIS — N760 Acute vaginitis: Secondary | ICD-10-CM | POA: Diagnosis not present

## 2023-03-10 ENCOUNTER — Ambulatory Visit (INDEPENDENT_AMBULATORY_CARE_PROVIDER_SITE_OTHER): Payer: BLUE CROSS/BLUE SHIELD | Admitting: Internal Medicine

## 2023-03-10 ENCOUNTER — Encounter: Payer: Self-pay | Admitting: Internal Medicine

## 2023-03-10 ENCOUNTER — Other Ambulatory Visit (INDEPENDENT_AMBULATORY_CARE_PROVIDER_SITE_OTHER): Payer: BLUE CROSS/BLUE SHIELD

## 2023-03-10 VITALS — BP 134/80 | HR 89 | Temp 98.2°F | Ht 61.0 in | Wt 145.0 lb

## 2023-03-10 DIAGNOSIS — R030 Elevated blood-pressure reading, without diagnosis of hypertension: Secondary | ICD-10-CM

## 2023-03-10 DIAGNOSIS — Z0000 Encounter for general adult medical examination without abnormal findings: Secondary | ICD-10-CM | POA: Diagnosis not present

## 2023-03-10 LAB — COMPREHENSIVE METABOLIC PANEL
ALT: 16 U/L (ref 0–35)
AST: 21 U/L (ref 0–37)
Albumin: 4.5 g/dL (ref 3.5–5.2)
Alkaline Phosphatase: 37 U/L — ABNORMAL LOW (ref 39–117)
BUN: 15 mg/dL (ref 6–23)
CO2: 27 meq/L (ref 19–32)
Calcium: 9.2 mg/dL (ref 8.4–10.5)
Chloride: 97 meq/L (ref 96–112)
Creatinine, Ser: 0.93 mg/dL (ref 0.40–1.20)
GFR: 64.83 mL/min (ref >60.00)
Glucose, Bld: 128 mg/dL — ABNORMAL HIGH (ref 70–99)
Potassium: 3.7 meq/L (ref 3.5–5.1)
Sodium: 133 meq/L — ABNORMAL LOW (ref 135–145)
Total Bilirubin: 0.5 mg/dL (ref 0.2–1.2)
Total Protein: 7.3 g/dL (ref 6.0–8.3)

## 2023-03-10 LAB — LIPID PANEL
Cholesterol: 192 mg/dL (ref 0–200)
HDL: 61.2 mg/dL (ref >39.00)
LDL Cholesterol: 118 mg/dL — ABNORMAL HIGH (ref 0–99)
NonHDL: 130.38
Total CHOL/HDL Ratio: 3
Triglycerides: 61 mg/dL (ref 0.0–149.0)
VLDL: 12.2 mg/dL (ref 0.0–40.0)

## 2023-03-10 LAB — CBC
HCT: 40.7 % (ref 36.0–46.0)
Hemoglobin: 13.5 g/dL (ref 12.0–15.0)
MCHC: 33.2 g/dL (ref 30.0–36.0)
MCV: 95.6 fL (ref 78.0–100.0)
Platelets: 239 10*3/uL (ref 150.0–400.0)
RBC: 4.26 Mil/uL (ref 3.87–5.11)
RDW: 13.6 % (ref 11.5–15.5)
WBC: 7 10*3/uL (ref 4.0–10.5)

## 2023-03-10 LAB — TSH: TSH: 1.08 u[IU]/mL (ref 0.35–5.50)

## 2023-03-10 NOTE — Progress Notes (Signed)
   Subjective:   Patient ID: Crystal Ware, female    DOB: 1960-07-06, 62 y.o.   MRN: [id number]  HPI The patient is here for physical.  PMH, [facility], social history reviewed and updated  Review of Systems  Constitutional: Negative.   HENT: Negative.    Eyes: Negative.   Respiratory:  Negative for cough, chest tightness and shortness of breath.   Cardiovascular:  Negative for chest pain, palpitations and leg swelling.  Gastrointestinal:  Negative for abdominal distention, abdominal pain, constipation, diarrhea, nausea and vomiting.  Musculoskeletal: Negative.   Skin: Negative.   Neurological: Negative.   Psychiatric/Behavioral: Negative.      Objective:  Physical Exam Constitutional:      Appearance: She is well-developed.  HENT:     Head: Normocephalic and atraumatic.  Cardiovascular:     Rate and Rhythm: Normal rate and regular rhythm.  Pulmonary:     Effort: Pulmonary effort is normal. No respiratory distress.     Breath sounds: Normal breath sounds. No wheezing or rales.  Abdominal:     General: Bowel sounds are normal. There is no distension.     Palpations: Abdomen is soft.     Tenderness: There is no abdominal tenderness. There is no rebound.  Musculoskeletal:     Cervical back: Normal range of motion.  Skin:    General: Skin is warm and dry.  Neurological:     Mental Status: She is alert and oriented to person, place, and time.     Coordination: Coordination normal.     Vitals:   03/10/23 1257 03/10/23 1304 03/10/23 1325  BP: (!) 144/80 (!) 144/80 134/80  Pulse: 89    Temp: 98.2 F (36.8 C)    TempSrc: Oral    SpO2: 99%    Weight: 145 lb (65.8 kg)    Height: 5\' 1"  (1.549 m)      Assessment & Plan:

## 2023-03-11 ENCOUNTER — Other Ambulatory Visit: Payer: Self-pay | Admitting: Internal Medicine

## 2023-03-11 ENCOUNTER — Ambulatory Visit (INDEPENDENT_AMBULATORY_CARE_PROVIDER_SITE_OTHER): Payer: BLUE CROSS/BLUE SHIELD

## 2023-03-11 DIAGNOSIS — R739 Hyperglycemia, unspecified: Secondary | ICD-10-CM

## 2023-03-11 LAB — HEMOGLOBIN A1C: Hgb A1c MFr Bld: 5.6 % (ref 4.6–6.5)

## 2023-03-13 DIAGNOSIS — Z0000 Encounter for general adult medical examination without abnormal findings: Secondary | ICD-10-CM | POA: Insufficient documentation

## 2023-03-13 NOTE — Assessment & Plan Note (Signed)
Uses metoprolol prn for tachycardia. BP at goal off meds currently. Monitor closely.

## 2023-03-13 NOTE — Assessment & Plan Note (Signed)
Flu shot declines. Shingrix due. Tetanus due. Colonoscopy up to date. Mammogram up to date, pap smear counseled. Counseled about sun safety and mole surveillance. Counseled about the dangers of distracted driving. Given 10 year screening recommendations.

## 2023-03-16 DIAGNOSIS — M2011 Hallux valgus (acquired), right foot: Secondary | ICD-10-CM | POA: Diagnosis not present

## 2023-03-16 DIAGNOSIS — L84 Corns and callosities: Secondary | ICD-10-CM | POA: Diagnosis not present

## 2023-03-16 DIAGNOSIS — M2041 Other hammer toe(s) (acquired), right foot: Secondary | ICD-10-CM | POA: Diagnosis not present

## 2023-03-16 DIAGNOSIS — M67471 Ganglion, right ankle and foot: Secondary | ICD-10-CM | POA: Diagnosis not present

## 2023-03-17 ENCOUNTER — Ambulatory Visit
Admission: RE | Admit: 2023-03-17 | Discharge: 2023-03-17 | Disposition: A | Discharge status: Home / Self Care | Payer: BLUE CROSS/BLUE SHIELD | Source: Ambulatory Visit | Attending: Obstetrics and Gynecology | Admitting: Obstetrics and Gynecology

## 2023-03-17 DIAGNOSIS — Z1231 Encounter for screening mammogram for malignant neoplasm of breast: Secondary | ICD-10-CM | POA: Diagnosis not present

## 2023-03-18 ENCOUNTER — Ambulatory Visit: Payer: BLUE CROSS/BLUE SHIELD | Admitting: Internal Medicine

## 2023-03-31 ENCOUNTER — Other Ambulatory Visit: Payer: Self-pay | Admitting: Obstetrics and Gynecology

## 2023-03-31 DIAGNOSIS — Z1231 Encounter for screening mammogram for malignant neoplasm of breast: Secondary | ICD-10-CM

## 2023-07-15 ENCOUNTER — Telehealth: Payer: Self-pay | Admitting: Internal Medicine

## 2023-07-15 ENCOUNTER — Ambulatory Visit: Payer: Self-pay

## 2023-07-15 NOTE — Telephone Encounter (Signed)
 Copied from CRM [id number]. Topic: General - Other >> Jul 15, 2023 10:55 AM Allyne Areola wrote: Reason for CRM: Patient is calling to speak with Dr.Crawford or her medical assistance regarding some allergies concerns she's been experiencing. Offered an appointment with Casimer Clear or Alyson Back; however, patient only wants to see Dr.Crawford. She asked for the medical assistance calls her [phone].

## 2023-07-15 NOTE — Telephone Encounter (Signed)
 Received call from agent to cancel this CRM request as patient is refusing to speak with nurse triage.  Reason for Disposition . [1] Caller requests to speak to PCP now AND [2] won't tell us  reason for call  (Exception: If 10 pm to 6 am, caller must first discuss reason for the call.)  Protocols used: PCP Call - No Triage-A-AH

## 2023-07-16 NOTE — Telephone Encounter (Signed)
 Copied from CRM [id number]. Topic: Clinical - Medical Advice >> Jul 15, 2023  4:48 PM Jethro Morrison wrote: Reason for CRM: THE PATIENT CALLED REQUESTING KEVIN STATED SHE WAS EXPECTING A CALL FROM DR CRAWFORD OR HER ASSISTANT PER NOTE FROM 1055 ON 04/16.   THE PATIENT STATED THE YOUNG MAN TOLD HER THAT DR CRAWFORD WAS OUT OF THE OFFICE UNTIL NEXT WEEK  AND SHE WANTS TO TALK TO DR CRAWFORD ABOUT HER ALLERGIES AND CONGESTION SHE IS HAVING STATED SHE IS TAKING MEDS AND WANTS DR CRAWFORD TO TELL HER WHAT SHE NEEDS TO DO.   OFFERED TO HAVE A TRIAGE NURSE CALL HER SHE REFUSED. I DID ADVISE HER THAT I CALLED THE CLINIC AND WAS ADVISED THAT DR CRAWFORD WAS BOOKED UP THROUGH 04/30. SHE HAS AN APPT FOR 04/30 BUT KEPT STATING SHE DID NOT WANT AN APPT SHE WANTS A CALL FROM DR.CRAWFORD OR HER ASSISTANT. PT IS VERY UPSET. SHE IS ALSO ON THE WAITLIST. I GAVE HER MY EXT ALSO BECAUSE SHE ASKED.

## 2023-07-16 NOTE — Telephone Encounter (Signed)
 Patient called back and was very upset because she stated that she did not see a missed call from a nurse at 3:53p or a voicemail. She was persistent with only speaking with Dr. Nicolette Barrio or Dr. Constance Dellen nurse for advice.    She stated she believes she may have a respiratory infection. She declined speaking with Triage and stated "what could they do for me? I need to know what Dr. Nicolette Barrio thinks" so I offered to schedule her an earlier appt for Monday with a different provider or go to an Urgent Care and she declined that as well.   After I explained that Dr. Nicolette Barrio would not be in the office until 4/23, she repeatedly asked to be worked into Dr. Constance Dellen schedule for an earlier appt before 4/30. I explained that I did not have override access and that would be a decision based on PCP's clinic team if they're able to accommodate her request.   Eventually, she agreed to only see Dr. Donnette Gal on Wednesday and asked for someone to call her if an earlier appt becomes available. I reassured her that I would document everything we discussed and make the clinic team aware of the situation. She replied that she will still callback on Monday in the morning. Please call and advise.

## 2023-07-16 NOTE — Telephone Encounter (Signed)
 Attempted to reach patient, unable to reach.  Dr.Crawford, FYI

## 2023-07-20 NOTE — Telephone Encounter (Signed)
 Patient states that she only want to be seen by Dr burns or Dr Nicolette Barrio and there were only availability soon as Wednesday for burns. Advised that patient should keep her appointment with Dr burns.

## 2023-07-22 ENCOUNTER — Encounter: Payer: Self-pay | Admitting: Internal Medicine

## 2023-07-22 ENCOUNTER — Ambulatory Visit (INDEPENDENT_AMBULATORY_CARE_PROVIDER_SITE_OTHER)

## 2023-07-22 ENCOUNTER — Ambulatory Visit (INDEPENDENT_AMBULATORY_CARE_PROVIDER_SITE_OTHER): Admitting: Internal Medicine

## 2023-07-22 VITALS — BP 132/78 | HR 83 | Temp 98.6°F | Ht 61.5 in | Wt 141.2 lb

## 2023-07-22 DIAGNOSIS — J209 Acute bronchitis, unspecified: Secondary | ICD-10-CM

## 2023-07-22 DIAGNOSIS — R059 Cough, unspecified: Secondary | ICD-10-CM | POA: Diagnosis not present

## 2023-07-22 MED ORDER — CEFDINIR 300 MG PO CAPS: 300.0000 mg | ORAL_CAPSULE | Freq: Two times a day (BID) | ORAL | 0 refills | Status: AC

## 2023-07-22 NOTE — Progress Notes (Signed)
 Subjective:    Patient ID: Crystal Ware, female    DOB: 1960-12-15, 63 y.o.   MRN: [id number]      HPI Ishia is here for  Chief Complaint  Patient presents with   Cough    Cough and congestion (started around 06/19/23) went to wedding in [place] and was around a bunch of people. 06/21/23 she had diarrhea really back. Got back on 3/26 nasal congestions felt terrible. Used antihistamine (started on 06/25/23) She started coughing last week and it was settled in her chest (yellow-gray mucus); She has been taking Muccinex x 7 days; Cough started last Tuesday (still has nasal congestion) and still coughing: Concerned for bronchitis; Oxygen has been running low (per patient)   Dizziness    Patient wants to have both her ears checked for fluid    Started about one month ago.  She developed nasal congestion initially.  Took astelin nasal spray.  There was no improvement.  Last Tuesday started having chest congestion and coughing. She started mucinex and it has helped break it up.  She has a headache - has had it the whole time.    Oxygen 91-94% at home.  Temp has been a little low.  She was concerned because her symptoms have lasted so long and was concerned about possibly developing pneumonia.     Medications and allergies reviewed with patient and updated if appropriate.  Current Outpatient Medications on File Prior to Visit  Medication Sig Dispense Refill   Ascorbic Acid (VITAMIN C) 100 MG tablet Take 100 mg by mouth daily.     baclofen (LIORESAL) 10 MG tablet Take 10 mg by mouth at bedtime.     Cholecalciferol 25 MCG (1000 UT) tablet Take by mouth.     estradiol (VIVELLE-DOT) 0.0375 MG/24HR Place 1 patch onto the skin 2 (two) times a week.     gentamicin  ointment (GARAMYCIN ) 0.1 % Apply 1 Application topically 3 (three) times daily. 15 g 3   LORazepam (ATIVAN) 1 MG tablet lorazepam 1 mg tablet     metoprolol tartrate (LOPRESSOR) 25 MG tablet      nystatin-triamcinolone (MYCOLOG  II) cream Apply topically.     progesterone (PROMETRIUM) 100 MG capsule Take 100 mg by mouth at bedtime.     valACYclovir (VALTREX) 500 MG tablet Take by mouth as needed.     vitamin B-12 (CYANOCOBALAMIN ) 100 MCG tablet Vitamin B-12  qd     No current facility-administered medications on file prior to visit.    Review of Systems  Constitutional:  Negative for fever.  HENT:  Positive for congestion. Negative for ear pain, sinus pain and sore throat.        Left left feels clogged and pops intermittently  Respiratory:  Positive for cough (with congestion - yellow-gray - getting a little more clear) and wheezing (mild in early morning when she wakes up). Negative for shortness of breath.   Cardiovascular:  Positive for chest pain (from coughing).  Musculoskeletal:  Positive for myalgias.  Neurological:  Positive for dizziness (intermittnet - chronic) and headaches.       Objective:   Vitals:   07/22/23 1401  BP: 132/78  Pulse: 83  Temp: 98.6 F (37 C)  SpO2: 95%   BP Readings from Last 3 Encounters:  07/22/23 132/78  03/10/23 134/80  09/02/22 122/74   Wt Readings from Last 3 Encounters:  07/22/23 140 lb (63.5 kg)  03/10/23 145 lb (65.8 kg)  09/02/22 145 lb (65.8 kg)  Body mass index is 26.45 kg/m.    Physical Exam Constitutional:      General: She is not in acute distress.    Appearance: Normal appearance. She is not ill-appearing.  HENT:     Head: Normocephalic and atraumatic.     Right Ear: Tympanic membrane, ear canal and external ear normal.     Left Ear: Tympanic membrane, ear canal and external ear normal.     Mouth/Throat:     Mouth: Mucous membranes are moist.     Pharynx: No oropharyngeal exudate or posterior oropharyngeal erythema.  Eyes:     Conjunctiva/sclera: Conjunctivae normal.  Cardiovascular:     Rate and Rhythm: Normal rate and regular rhythm.  Pulmonary:     Effort: Pulmonary effort is normal. No respiratory distress.     Breath sounds:  Wheezing (mild) and rales (mild) present.  Musculoskeletal:     Cervical back: Neck supple. No tenderness.  Lymphadenopathy:     Cervical: No cervical adenopathy.  Skin:    General: Skin is warm and dry.  Neurological:     Mental Status: She is alert.       DG Chest 2 View CLINICAL DATA:  Cough and wheezing.  Rule out pneumonia.  EXAM: CHEST - 2 VIEW  COMPARISON:  None Available.  FINDINGS: Both lungs are clear. Heart and mediastinum are within normal limits. Trachea is midline. No pleural effusions. No acute bone abnormality.  IMPRESSION: No active cardiopulmonary disease.  Electronically Signed   By: Elene Griffes M.D.   On: 07/22/2023 15:33       Assessment & Plan:    Cough, acute bronchitis: Acute Symptoms have been going on for approximately 1 month-maybe slight improvement in the cough Has had low oxygen levels for her low temperature at home Has wheezing and crackles on exam Concern for possible community-acquired pneumonia-chest x-ray today did not show pneumonia Probable bronchitis, early pneumonia or at least reactive airway disease Start Omnicef  300 mg twice daily x 7 days Continue Mucinex, cough suppressants, over-the-counter cold medications for symptom relief Call if no improvement

## 2023-07-22 NOTE — Patient Instructions (Addendum)
      Chest xray today.     Medications changes include :   Omnicef  twice a day x 7 days       Return if symptoms worsen or fail to improve.

## 2023-07-29 ENCOUNTER — Ambulatory Visit: Admitting: Internal Medicine

## 2023-11-16 NOTE — Progress Notes (Signed)
 Subjective:    Patient ID: Crystal Ware, female    DOB: Nov 26, 1960, 63 y.o.   MRN: [id number]     HPI Crystal Ware is here for follow up of her chronic medical problems.  Discussed the use of AI scribe software for clinical note transcription with the patient, who gave verbal consent to proceed.  History of Present Illness Crystal Ware is a 63 year old female who presents with dizziness, low heart rate, and cold intolerance.  She has been experiencing intermittent dizzy spells for over six months, occurring without specific triggers and as infrequently as every three months. These episodes cause her to feel off balance, requiring support from walls, but resolve quickly. On August 11th, she had a more severe episode at a grocery store, accompanied by lightheadedness and nausea, which improved after eating but led to severe indigestion. On the same day, she experienced room spinning when lying down, accompanied by nausea and a sensation of freezing and a low body temperature of 19F. Her heart rate dropped to the low fifties and even into the forties, with an associated drop in oxygen saturation to 92%, causing significant distress and fear of not waking up if she fell asleep.  Since that episode, she has noticed persistent cold intolerance, requiring a warmer environment to maintain a heart rate above 52 bpm. Her heart rate, usually around 65 bpm at rest, has been fluctuating, dropping when she feels cold. She also experienced a transient increase in heart rate to the nineties, which was unusual for her. She reports that years ago she was told she had 'subclinical hypothyroid' and experienced similar symptoms of cold intolerance at that time.   She reports a history of coughing that improved with oral antihistamines, which she stopped on August 11th. No current shortness of breath, wheezing, or significant nasal congestion, although she experienced some nasal drainage and headaches recently, which  she attributes to sinus issues.  She has a history of bladder retention and frequency, diagnosed as interstitial cystitis by her urologist. She has chronic microhematuria.  No recent changes in urination patterns.   Medications and allergies reviewed with patient and updated if appropriate.  Current Outpatient Medications on File Prior to Visit  Medication Sig Dispense Refill   Ascorbic Acid (VITAMIN C) 100 MG tablet Take 100 mg by mouth daily.     baclofen (LIORESAL) 10 MG tablet Take 10 mg by mouth at bedtime.     Cholecalciferol 25 MCG (1000 UT) tablet Take by mouth.     estradiol (VIVELLE-DOT) 0.0375 MG/24HR Place 1 patch onto the skin 2 (two) times a week.     gentamicin  ointment (GARAMYCIN ) 0.1 % Apply 1 Application topically 3 (three) times daily. 15 g 3   LORazepam (ATIVAN) 1 MG tablet lorazepam 1 mg tablet     metoprolol tartrate (LOPRESSOR) 25 MG tablet      nystatin-triamcinolone (MYCOLOG II) cream Apply topically.     progesterone (PROMETRIUM) 100 MG capsule Take 100 mg by mouth at bedtime.     valACYclovir (VALTREX) 500 MG tablet Take by mouth as needed.     vitamin B-12 (CYANOCOBALAMIN ) 100 MCG tablet Vitamin B-12  qd     No current facility-administered medications on file prior to visit.     Review of Systems  Constitutional:  Positive for chills (temp was 96).  HENT:  Positive for postnasal drip (mild at times). Negative for congestion, ear pain, sinus pain and sore throat.  Ear felt full  Respiratory:  Positive for cough (better). Negative for shortness of breath and wheezing.   Cardiovascular:  Positive for palpitations (yesterday). Negative for chest pain.       HR in 40-50's at times associated with low oxygen on pulse ox - about one week ago  Gastrointestinal:  Positive for nausea. Negative for abdominal pain, constipation and diarrhea.       GERD episode  Endocrine: Positive for cold intolerance.  Genitourinary:  Positive for frequency. Negative for  dysuria, hematuria and urgency.  Neurological:  Positive for dizziness (intermittent - transient - balance is off and has to grab onto something -- started over 6 months), light-headedness (has had an episode of lightheadedness) and headaches (occ).       Objective:   Vitals:   11/17/23 1407  BP: 126/74  Pulse: 69  Temp: 98.5 F (36.9 C)  SpO2: 96%   BP Readings from Last 3 Encounters:  11/17/23 126/74  07/22/23 132/78  03/10/23 134/80   Wt Readings from Last 3 Encounters:  11/17/23 141 lb (64 kg)  07/22/23 141 lb 3.2 oz (64 kg)  03/10/23 145 lb (65.8 kg)   Body mass index is 26.21 kg/m.    Physical Exam Constitutional:      General: She is not in acute distress.    Appearance: Normal appearance. She is not ill-appearing.  HENT:     Head: Normocephalic and atraumatic.     Right Ear: Tympanic membrane, ear canal and external ear normal.     Left Ear: Tympanic membrane, ear canal and external ear normal.     Mouth/Throat:     Mouth: Mucous membranes are moist.     Pharynx: No oropharyngeal exudate or posterior oropharyngeal erythema.  Eyes:     Conjunctiva/sclera: Conjunctivae normal.  Cardiovascular:     Rate and Rhythm: Normal rate and regular rhythm.     Heart sounds: Normal heart sounds.  Pulmonary:     Effort: Pulmonary effort is normal. No respiratory distress.     Breath sounds: Normal breath sounds. No wheezing or rales.  Musculoskeletal:     Cervical back: Neck supple. No tenderness.     Right lower leg: No edema.     Left lower leg: No edema.  Lymphadenopathy:     Cervical: No cervical adenopathy.  Skin:    General: Skin is warm and dry.     Findings: No rash.  Neurological:     Mental Status: She is alert. Mental status is at baseline.  Psychiatric:        Mood and Affect: Mood normal.        Behavior: Behavior normal.       EKG: Normal sinus rhythm at 63 bpm, normal EKG.  No prior EKG for comparison.  Lab Results  Component Value Date    WBC 7.0 03/10/2023   HGB 13.5 03/10/2023   HCT 40.7 03/10/2023   PLT 239.0 03/10/2023   GLUCOSE 128 (H) 03/10/2023   CHOL 192 03/10/2023   TRIG 61.0 03/10/2023   HDL 61.20 03/10/2023   LDLCALC 118 (H) 03/10/2023   ALT 16 03/10/2023   AST 21 03/10/2023   NA 133 (L) 03/10/2023   K 3.7 03/10/2023   CL 97 03/10/2023   CREATININE 0.93 03/10/2023   BUN 15 03/10/2023   CO2 27 03/10/2023   TSH 1.08 03/10/2023   HGBA1C 5.6 03/11/2023     Assessment & Plan:     Encounter Diagnoses  Name Primary?  Urinary frequency    Cold intolerance Yes   Bradycardia    Palpitations    Vertigo    Acute cough       Assessment and Plan Assessment & Plan Bradycardia and cold intolerance Intermittent bradycardia with heart rate in the 40s, associated with cold intolerance. Differential includes thyroid  dysfunction, infection and physiological response to cold. - Order thyroid  function tests, CBC, CMP. - Perform EKG. - Consider Holter monitor for 7 days if symptoms persist. - Monitor heart rate at home.  Vertigo and dizziness Intermittent vertigo and dizziness, likely due to middle ear inflammation from a cold or allergies versus BPPV. - Prescribe meclizine  for vertigo as needed.  Discussed possible side effects of drowsiness. - Monitor for recurrence of symptoms.  We can consider vestibular PT  Palpitations Palpitations with heart rate increase to 90s, resolving spontaneously, associated with bradycardia episodes. - Perform EKG. - Consider Holter monitor for 7 days if symptoms persist. - CBC, CMP, TSH  Cough, improved Cough improved after antihistamines, likely allergic or viral etiology. - Consider antihistamines if symptoms return.  Chronic urinary frequency Chronic urinary frequency due to bladder retention and possible interstitial cystitis. - Check urinalysis, urine culture.  Advised if symptoms persist, worsen she should call or return for further evaluation.

## 2023-11-17 ENCOUNTER — Encounter: Payer: Self-pay | Admitting: Internal Medicine

## 2023-11-17 ENCOUNTER — Other Ambulatory Visit (INDEPENDENT_AMBULATORY_CARE_PROVIDER_SITE_OTHER)

## 2023-11-17 ENCOUNTER — Ambulatory Visit (INDEPENDENT_AMBULATORY_CARE_PROVIDER_SITE_OTHER): Admitting: Internal Medicine

## 2023-11-17 ENCOUNTER — Ambulatory Visit: Payer: Self-pay | Admitting: Internal Medicine

## 2023-11-17 VITALS — BP 126/74 | HR 69 | Temp 98.5°F | Ht 61.5 in | Wt 141.0 lb

## 2023-11-17 DIAGNOSIS — R002 Palpitations: Secondary | ICD-10-CM | POA: Diagnosis not present

## 2023-11-17 DIAGNOSIS — R350 Frequency of micturition: Secondary | ICD-10-CM

## 2023-11-17 DIAGNOSIS — R051 Acute cough: Secondary | ICD-10-CM

## 2023-11-17 DIAGNOSIS — R001 Bradycardia, unspecified: Secondary | ICD-10-CM

## 2023-11-17 DIAGNOSIS — R42 Dizziness and giddiness: Secondary | ICD-10-CM

## 2023-11-17 DIAGNOSIS — R6889 Other general symptoms and signs: Secondary | ICD-10-CM | POA: Diagnosis not present

## 2023-11-17 LAB — URINALYSIS, ROUTINE W REFLEX MICROSCOPIC
Bilirubin Urine: NEGATIVE
Ketones, ur: NEGATIVE
Leukocytes,Ua: NEGATIVE
Nitrite: NEGATIVE
Specific Gravity, Urine: <=1.005 — AB (ref 1.000–1.030)
Total Protein, Urine: NEGATIVE
Urine Glucose: NEGATIVE
Urobilinogen, UA: 0.2 (ref 0.0–1.0)
WBC, UA: NONE SEEN (ref 0–?)
pH: 6 (ref 5.0–8.0)

## 2023-11-17 LAB — COMPREHENSIVE METABOLIC PANEL WITH GFR
ALT: 13 U/L (ref 0–35)
AST: 19 U/L (ref 0–37)
Albumin: 4.3 g/dL (ref 3.5–5.2)
Alkaline Phosphatase: 42 U/L (ref 39–117)
BUN: 15 mg/dL (ref 6–23)
CO2: 28 meq/L (ref 19–32)
Calcium: 9.5 mg/dL (ref 8.4–10.5)
Chloride: 101 meq/L (ref 96–112)
Creatinine, Ser: 0.96 mg/dL (ref 0.40–1.20)
GFR: 62.11 mL/min (ref >60.00)
Glucose, Bld: 80 mg/dL (ref 70–99)
Potassium: 4.1 meq/L (ref 3.5–5.1)
Sodium: 137 meq/L (ref 135–145)
Total Bilirubin: 0.4 mg/dL (ref 0.2–1.2)
Total Protein: 7.7 g/dL (ref 6.0–8.3)

## 2023-11-17 LAB — CBC WITH DIFFERENTIAL/PLATELET
Basophils Absolute: 0.1 K/uL (ref 0.0–0.1)
Basophils Relative: 0.8 % (ref 0.0–3.0)
Eosinophils Absolute: 0.1 K/uL (ref 0.0–0.7)
Eosinophils Relative: 1 % (ref 0.0–5.0)
HCT: 39.9 % (ref 36.0–46.0)
Hemoglobin: 13.2 g/dL (ref 12.0–15.0)
Lymphocytes Relative: 18.2 % (ref 12.0–46.0)
Lymphs Abs: 1.3 K/uL (ref 0.7–4.0)
MCHC: 33.1 g/dL (ref 30.0–36.0)
MCV: 93.6 fl (ref 78.0–100.0)
Monocytes Absolute: 0.4 K/uL (ref 0.1–1.0)
Monocytes Relative: 6 % (ref 3.0–12.0)
Neutro Abs: 5.4 K/uL (ref 1.4–7.7)
Neutrophils Relative %: 74 % (ref 43.0–77.0)
Platelets: 285 K/uL (ref 150.0–400.0)
RBC: 4.27 Mil/uL (ref 3.87–5.11)
RDW: 12.8 % (ref 11.5–15.5)
WBC: 7.3 K/uL (ref 4.0–10.5)

## 2023-11-17 LAB — TSH: TSH: 1.82 u[IU]/mL (ref 0.35–5.50)

## 2023-11-17 MED ORDER — MECLIZINE HCL 12.5 MG PO TABS: 12.5000 mg | ORAL_TABLET | Freq: Three times a day (TID) | ORAL | 0 refills | Status: AC | PRN

## 2023-11-17 NOTE — Patient Instructions (Addendum)
    An EKG was done today    Blood work was ordered.       Medications changes include :   meclizine  12.5 mg three times a day as need     Return if symptoms worsen or fail to improve.

## 2023-11-18 LAB — URINE CULTURE
MICRO NUMBER:: 16852016
Result:: NO GROWTH
SPECIMEN QUALITY:: ADEQUATE

## 2023-11-24 ENCOUNTER — Telehealth: Payer: Self-pay | Admitting: Internal Medicine

## 2023-11-24 ENCOUNTER — Ambulatory Visit: Admitting: Internal Medicine

## 2023-11-24 DIAGNOSIS — H9319 Tinnitus, unspecified ear: Secondary | ICD-10-CM

## 2023-11-24 DIAGNOSIS — R42 Dizziness and giddiness: Secondary | ICD-10-CM

## 2023-11-24 NOTE — Telephone Encounter (Signed)
 I can refer to ENT.  I would also recommend starting with physical therapy which can often help-it is a specific physical therapy for dizziness.  It may take 3-4 weeks to see ENT so starting with physical therapy sooner would be a good idea  If she still having fluctuations in her heart rate we can consider the Holter monitor or have her see cardiology to rule out a cardiac cause.  Let me know what she wishes to do.

## 2023-11-24 NOTE — Telephone Encounter (Signed)
 Burns saw on 8/19 did she have any advice for her on this? If not I am happy to review her notes

## 2023-11-24 NOTE — Telephone Encounter (Signed)
 Patient is still experiencing dizziness and vertigo.  She is now experiencing a roaring in her ear.  Patient wants you to return her call. The roaring lasted 8 hours.  Should she be scheduled with an ENT?

## 2023-11-24 NOTE — Telephone Encounter (Signed)
**Note De-identified  [place] Obfuscation** Please advise 

## 2023-11-26 NOTE — Telephone Encounter (Signed)
 Referral to ENT ordered.

## 2023-11-26 NOTE — Telephone Encounter (Signed)
 Patient would like Tobias to call her back so she can further explain her symptoms. She said the dizziness has gotten better, but she would like to give further information to [place] rather than [facility] or the front desk. Best callback is [phone].

## 2023-12-09 DIAGNOSIS — B3731 Acute candidiasis of vulva and vagina: Secondary | ICD-10-CM | POA: Diagnosis not present

## 2023-12-09 DIAGNOSIS — N898 Other specified noninflammatory disorders of vagina: Secondary | ICD-10-CM | POA: Diagnosis not present

## 2023-12-16 ENCOUNTER — Telehealth: Payer: Self-pay | Admitting: Internal Medicine

## 2023-12-16 NOTE — Telephone Encounter (Signed)
 Spoke with patient to sched. AWV on or after 3.1.26  DPO 9.17.25

## 2024-01-01 ENCOUNTER — Telehealth: Payer: Self-pay

## 2024-01-01 NOTE — Telephone Encounter (Signed)
 Copied from CRM [id number]. Topic: Appointments - Scheduling Inquiry for Clinic >> Jan 01, 2024  3:56 PM [name] wrote: Reason for CRM: Patient said she had questions about her appointment and she rather speak to [facility] because she knows him. Patient is requesting a call back at [phone].

## 2024-01-04 NOTE — Progress Notes (Signed)
 Today's visit was completed via a real-time telehealth (see specific modality noted below). The patient/authorized person provided oral consent at the time of the visit to engage in a telemedicine encounter with the present provider at [facility]. The patient/authorized person was informed of the potential benefits, limitations, and risks of telemedicine. The patient/authorized person expressed understanding that the laws that protect confidentiality also apply to telemedicine. The patient/authorized person acknowledged understanding that telemedicine does not provide emergency services and that he or she would need to call 911 or proceed to the nearest hospital for help if such a need arose.   Total time spent in the clinical discussion 22 minutes Telehealth Modality: Phone visit (audio only)    Chief Complaint: This patient is seen in follow up   Fall Screening     History of Present Illness: Crystal Ware is a 63 y.o. female with history of UTI and interstitial cystitis.  Last seen by Dr. Janit 12/30/2022: Prednisone, Hydrochlorothiazide, and Propoxyphene n-acetaminophen   HPIpt with IC is here for f/u She is having issues with frequency and with her pelvic floor  She has had issues with leakage with laughing She does have Bacxl she really does not think that she needs any specific for her interstitial cystitis but does find the medicines for the pelvic floor helpful we have also maintained on Macrodantin suppression is kept her free of infections.  We will begin evaluation of this urinary leakage problem I did stress to her we will to be very careful about any therapy for stress incontinence as it might exacerbate her bladder we did discuss there is been stress and urge incontinence in detail further plans made once we have completed evaluation with urodynamics thanks    01/04/2024 Symptoms are stable. Has PFPD and reported retention of urine. She has talked about PT and  has not done this recently. She's followed Dr. Janit for 20 years. She was having mix of retention and stress incontinence it sounds likes. Recently incontinence (urinary) has been much better. She's taking macrodantin after intercourse or with flares in the past.   We discussed risk of chronic usage with antibiotics. She's not taken Dmannose or methenamine in the past. She's on Estradilol patch.   She's having nocturia a little more lately and requests a culture which was ordered.   Please see dictation for additional details.  Current Medications[1]   Allergies[2]  Medical History[3]   Surgical History[4]   Family History[5]       There were no vitals filed for this visit.  There is no height or weight on file to calculate BMI.  ROS  See Assessment and HPI.  Genitourinary:  As per HPI.  Physical Exam  Physical Exam No distress in voice  Assesment :     Discussed the following plan:  1. Chronic interstitial cystitis  LORazepam (ATIVAN) 1 mg tablet   LORazepam (ATIVAN) 1 mg tablet   LORazepam (ATIVAN) 1 mg tablet   nitrofurantoin (MACRODANTIN) 50 mg capsule   Surgical Urine Culture, Sterile Collection    2. Pelvic floor dysfunction  LORazepam (ATIVAN) 1 mg tablet   LORazepam (ATIVAN) 1 mg tablet   LORazepam (ATIVAN) 1 mg tablet   nitrofurantoin (MACRODANTIN) 50 mg capsule   MISCELLANEOUS MEDICATION/PRODUCT   baclofen (LIORESAL) 10 mg tablet    3. Increased frequency of urination  Surgical Urine Culture, Sterile Collection    4. History of UTI        Advised the following  Put in ordered for PFPT; refilled medications. Discussed risks of chronic antibiotic use and discussed options; wants to remain on PRN macrodantin aware of risk or resistance or  She has significant PFD and has not been to PT in some time. She will find a local therapist per her request and I have a referral in we can fax It looks like UDS was recommended and she has not been for  that; her continence has improved since that discussion and would favor revisiting if symptoms worsen or return. Much improved currently. Can just check PVR at next visit.   Return Sx check with PVR 3 months. She is aware Dr. Janit is out and we may have to find an alternate provider or taper in the future.  Orders Placed This Encounter  Procedures  . Surgical Urine Culture, Sterile Collection    Standing Status:   Future    Expected Date:   01/04/2024    Expiration Date:   01/03/2025    There are no Patient Instructions on file for this visit.  Return for 3 months with PVR and UA in person.   Electronically signed by: Seena Maranda Mail III, PA-C 01/04/2024 1:07 PM       [1]  Current Outpatient Medications:  .  ascorbic acid (VITAMIN C) 500 mg tablet, Take 500 mg by mouth Once Daily., Disp: , Rfl:  .  baclofen (LIORESAL) 10 mg tablet, Take 1 tablet (10 mg total) by mouth nightly., Disp: 30 tablet, Rfl: 3 .  cholecalciferol (VITAMIN D3) 1,000 unit (25 mcg) tablet, Take 1,000 Units by mouth Once Daily., Disp: , Rfl:  .  cyanocobalamin  (VITAMIN B12) 1,000 mcg tablet, Take 500 mcg by mouth Once Daily., Disp: , Rfl:  .  fluconazole (DIFLUCAN) 150 mg tablet, Take 1 tablet by mouth every 3 (three) days., Disp: , Rfl:  .  gentamicin  (GARAMYCIN ) 0.1 % ointment, Apply 1 Application topically as needed., Disp: , Rfl:  .  [START ON 01/28/2024] LORazepam (ATIVAN) 1 mg tablet, Take 1 tablet (1 mg total) by mouth 2 (two) times a day as needed for anxiety., Disp: 60 tablet, Rfl: 0 .  [START ON 02/28/2024] LORazepam (ATIVAN) 1 mg tablet, Take 1 tablet (1 mg total) by mouth 2 (two) times a day as needed for anxiety. In morning and evening as needed., Disp: 60 tablet, Rfl: 0 .  [START ON 03/29/2024] LORazepam (ATIVAN) 1 mg tablet, Take 1 tablet (1 mg total) by mouth 2 (two) times a day as needed for anxiety., Disp: 60 tablet, Rfl: 0 .  metoprolol tartrate (LOPRESSOR) 25 mg tablet, Take 25 mg by mouth  as needed., Disp: , Rfl:  .  MISCELLANEOUS MEDICATION/PRODUCT, Referral to pelvic floor physical therapy.  Diagnoses: high tone pelvic floor dysfunction with pelvic pain.  Evaluate and treat. Biofeedback, E stim, myofascial release, and other modalities as indicated. Finding a local pelvic floor physical therapist: [email or web address], Disp: 1 each, Rfl: 0 .  nitrofurantoin (MACRODANTIN) 50 mg capsule, Take 1 capsule by mouth within 4 hours after sex for UTI prevention., Disp: 30 capsule, Rfl: 2 .  nystatin-triamcinolone (MYCOLOG II) 100,000-0.1 unit/g-% cream, Apply topically as needed., Disp: , Rfl:  .  progesterone (PROMETRIUM) 200 mg cap capsule, Take 100 mg by mouth., Disp: , Rfl:  .  valACYclovir (VALTREX) 500 mg tablet, as needed., Disp: , Rfl:  .  Vivelle-Dot 0.0375 mg/24 hr, APPLY 1 PATCH ONTO THE SKIN TWICE WEEKLY, Disp: , Rfl: 3 [2] Allergies Allergen Reactions  . Prednisone  Other (See Comments)    Reaction: Insomnia and feels hyper, unable eat- per pt, Causes extreme hyperactivity  . Hydrochlorothiazide Other (See Comments)    Cold, feet felt like blocks of ice  . Propoxyphene N-Acetaminophen Other (See Comments)    Reaction: Sick  [3] Past Medical History: Diagnosis Date  . Allergy   . Hormone disorder   . Hypertension   . Osteomyelitis    (CMD)   . Urinary tract infection   [4] Past Surgical History: Procedure Laterality Date  . CYSTOSCOPY     Procedure: CYSTOSCOPY  . OTHER SURGICAL HISTORY     Procedure: OTHER SURGICAL HISTORY (OTHER SURGICAL HISTORY); osteomyelitis surgery on the Left big toe   . TOE SURGERY     Procedure: TOE SURGERY  . TONSILLECTOMY     Procedure: TONSILLECTOMY  [5] No family history on file.

## 2024-01-15 NOTE — Telephone Encounter (Addendum)
 Patient is calling clinic in regards to her prescriptions. She states her  LORazepam (ATIVAN) 1 mg tablet was sent to [facility]. She does not fill rx at [facility] because they do not have the manufacture that she likes to us . Those have now been cancelled. She was unable to get it transferred.  She would like those called into [facility] [address] in [place] with 3 refills. Phone number [phone]    Callback for patient [phone]

## 2024-01-18 NOTE — Telephone Encounter (Signed)
 Received reply from [facility] saying that he has to write the prescription without additional refills (non MD's are unable to write prescriptions for controlled substances with additional refills on the prescription).  Per pts EMR, the prescription has not been sent to the pharmacy Messaged [facility] again who replied just now saying that the prescription has been sent to the pharmacy.  Pts EMR shows that the pharmacy did receive the electronic prescription.  Called patient to advise her of above and reason why prescription has to be written the way it is by [facility].  She voiced understanding and will go and pick up her prescription.  Thanked me for helping her out and the information

## 2024-01-18 NOTE — Telephone Encounter (Signed)
 Returned patients call at number provided to apologize that the prescription was sent to the incorrect pharmacy.  Also apologized hat we did not get her called back on Friday afternoon.  Advised that [facility] [address] [facility] been placed as the current preferred pharmacy.  Patient not happy that the prescriptions were sent to the incorrect pharmacy.  Also reports that per the pharmacy, her last prescription that had been written by Dr. Janit has been cancelled therefore ran out of medication (Ativan) the end of last week.  States that when she was given her last refill in early October, it was not a complete refill (did not receive the total that it had been written for).  States pharmacy was aware and had made a notation of it.  Reports that she had been planning on picking up the remaining portion of the prescription over this past weekend.     [name]: Pt reports that her Lorazepam prescription was sent to the incorrect pharmacy and due to being a controlled substance, it cannot be transferred.  Please res[facility]  Would like 1 prescription  sent with 5 additional refills.  Reports that she does not fill her prescriptions early or try to fill them early.  States that the pharmacy does not let her fill them early and she keeps track of it also.  Would prefer either no diagnosis be noted or pelvic floor dysfunction if one needs to be placed on the Rx since that is why she is taking it.  Would like the Rx sent ASAP since she is out (when the new Rx's were sent the remaining refill that she had on her Ativan prescription was cancelled.  Pls let me know when the Rx is sent Thanks [name]

## 2024-01-18 NOTE — Telephone Encounter (Addendum)
 Patient calling asking for a call back to advise on her getting her Rx for Lorazepam and to [facility]. This was sent to [facility].  She says that she had [facility] left [facility] but that was cancelled.   Rx in list says for a future fill.  Unsure on why this is, as she is all out and hasn't had any since last Friday.    Patient wants answers on why this never happen and no one called her back.  Asking for the nurse manager to call her back to explain what is going on with this medication.   She was seen last on 01/04/24  Please call her back.  Thanks Clarita  CB # [phone]  or try her cell # [phone]

## 2024-01-20 ENCOUNTER — Telehealth: Payer: Self-pay

## 2024-01-20 NOTE — Telephone Encounter (Signed)
 Copied from CRM #[id number]. Topic: Clinical - Medical Advice >> Jan 20, 2024  2:38 PM Anairis L wrote: Reason for CRM: Wants to know What all [employer] a preventative visit. Spoke to Crystal Ware and he was going to have a CMA reach out, no one ever called her back.Wants to speak to front desk.

## 2024-01-20 NOTE — Telephone Encounter (Signed)
 Spoke with patient and addressed questions about a Preventative Visit as opposed to a regular Office Visit. Pt seemed to be ok with explanation.

## 2024-02-24 ENCOUNTER — Ambulatory Visit: Admitting: Internal Medicine

## 2024-03-08 ENCOUNTER — Ambulatory Visit: Admitting: Internal Medicine

## 2024-03-15 ENCOUNTER — Encounter: Admitting: Internal Medicine

## 2024-03-15 ENCOUNTER — Ambulatory Visit: Admitting: Internal Medicine

## 2024-03-16 ENCOUNTER — Encounter: Payer: Self-pay | Admitting: Obstetrics and Gynecology

## 2024-03-16 DIAGNOSIS — Z1231 Encounter for screening mammogram for malignant neoplasm of breast: Secondary | ICD-10-CM

## 2024-03-17 ENCOUNTER — Ambulatory Visit: Payer: BLUE CROSS/BLUE SHIELD

## 2024-04-06 ENCOUNTER — Encounter (INDEPENDENT_AMBULATORY_CARE_PROVIDER_SITE_OTHER): Payer: Self-pay | Admitting: Otolaryngology

## 2024-04-06 ENCOUNTER — Ambulatory Visit (INDEPENDENT_AMBULATORY_CARE_PROVIDER_SITE_OTHER): Admitting: Otolaryngology

## 2024-04-06 VITALS — BP 117/73 | HR 78 | Ht 61.5 in | Wt 140.0 lb

## 2024-04-06 DIAGNOSIS — H9312 Tinnitus, left ear: Secondary | ICD-10-CM

## 2024-04-06 DIAGNOSIS — R42 Dizziness and giddiness: Secondary | ICD-10-CM | POA: Diagnosis not present

## 2024-04-06 DIAGNOSIS — H6982 Other specified disorders of Eustachian tube, left ear: Secondary | ICD-10-CM | POA: Diagnosis not present

## 2024-04-07 DIAGNOSIS — R42 Dizziness and giddiness: Secondary | ICD-10-CM | POA: Insufficient documentation

## 2024-04-07 DIAGNOSIS — H6982 Other specified disorders of Eustachian tube, left ear: Secondary | ICD-10-CM | POA: Insufficient documentation

## 2024-04-07 DIAGNOSIS — H9312 Tinnitus, left ear: Secondary | ICD-10-CM | POA: Insufficient documentation

## 2024-04-07 NOTE — Progress Notes (Signed)
 CC: Left ear pressure, tinnitus, dizziness  Discussed the use of AI scribe software for clinical note transcription with the patient, who gave verbal consent to proceed.  History of Present Illness Crystal Ware is a 64 year old female who presents with left-sided ear pressure, tinnitus, and dizziness following a prolonged respiratory illness.  In March 2025, she developed acute bronchitis and pneumonia, possibly following a flu-like illness after travel. During this illness, she experienced persistent left-sided aural pressure without otalgia or a sensation of aural fullness. The respiratory illness lasted approximately two months, after which the ear pressure partially improved but did not fully resolve.  She subsequently experienced a recurrence of respiratory symptoms, again lasting about two months. During this period, she developed episodes of dizziness, including a single episode of true vertigo characterized by a spinning sensation upon lying down, which resolved immediately upon sitting up and was associated with nausea without emesis. She denies recurrence of vertigo but continued to experience intermittent disequilibrium, requiring support for ambulation. She suspects these episodes may have been related to bradycardia, though this is unconfirmed. These symptoms have since resolved, and she currently denies dizziness or imbalance.  In August 2025, she experienced a sudden onset of left-sided tinnitus described as a roaring noise, lasting approximately twelve hours without associated otalgia. This episode has not recurred. Since then, she intermittently perceives a left-sided noise described as similar to an air purifier, with a sensation of air movement and vague sound, as well as occasional very faint popping noises. She also notes rare mild otorrhea and minimal cerumen in the left ear, but no significant drainage. She denies current otalgia, significant hearing loss, or ongoing otorrhea.  She  has a remote history of significant ear problems in childhood, including a procedure involving needle puncture of the tympanic membrane, but no history of tympanostomy tube placement or ear surgery. She has not undergone audiometric evaluation since childhood.   Past Medical History:  Diagnosis Date   Anal fissure    Anxiety    Cystitis     Past Surgical History:  Procedure Laterality Date   TOE SURGERY     TONSILLECTOMY      Family History  Problem Relation Age of Onset   Breast cancer Maternal Aunt    Heart disease Maternal Aunt    Atrial fibrillation Maternal Aunt    Colon cancer Neg Hx    Stomach cancer Neg Hx    Esophageal cancer Neg Hx    Colon polyps Neg Hx     Social History:  reports that she has never smoked. She has never used smokeless tobacco. She reports that she does not drink alcohol and does not use drugs.  Allergies: Allergies[1]  Prior to Admission medications  Medication Sig Start Date End Date Taking? Authorizing Provider  Ascorbic Acid (VITAMIN C) 100 MG tablet Take 100 mg by mouth daily.   Yes [provider]  baclofen (LIORESAL) 10 MG tablet Take 10 mg by mouth at bedtime.   Yes [provider]  Cholecalciferol 25 MCG (1000 UT) tablet Take by mouth.   Yes [provider]  estradiol (VIVELLE-DOT) 0.0375 MG/24HR Place 1 patch onto the skin 2 (two) times a week.   Yes [provider]  LORazepam (ATIVAN) 1 MG tablet lorazepam 1 mg tablet   Yes [provider]  meclizine  (ANTIVERT ) 12.5 MG tablet Take 1 tablet (12.5 mg total) by mouth 3 (three) times daily as needed for dizziness. 11/17/23  Yes Geofm Glade PARAS, MD  nystatin-triamcinolone (MYCOLOG II) cream Apply topically. 03/11/22  Yes [provider]  progesterone (PROMETRIUM) 100 MG capsule Take 100 mg by mouth at bedtime.   Yes [provider]  valACYclovir (VALTREX) 500 MG tablet Take by mouth as needed. 11/29/13  Yes [provider]   vitamin B-12 (CYANOCOBALAMIN ) 100 MCG tablet Vitamin B-12  qd   Yes [provider]  gentamicin  ointment (GARAMYCIN ) 0.1 % Apply 1 Application topically 3 (three) times daily. Patient not taking: Reported on 04/06/2024 11/13/22   McCaughan, Dia D, DPM    Blood pressure 117/73, pulse 78, height 5' 1.5 (1.562 m), weight 140 lb (63.5 kg), SpO2 96%. Exam: General: Communicates without difficulty, well nourished, no acute distress. Head: Normocephalic, no evidence injury, no tenderness, facial buttresses intact without stepoff. Face/sinus: No tenderness to palpation and percussion. Facial movement is normal and symmetric. Eyes: PERRL, EOMI. No scleral icterus, conjunctivae clear. Neuro: CN II exam reveals vision grossly intact.  No nystagmus at any point of gaze. Ears: Auricles well formed without lesions.  Ear canals are intact without mass or lesion.  No erythema or edema is appreciated.  The TMs are intact without fluid. Nose: External evaluation reveals normal support and skin without lesions.  Dorsum is intact.  Anterior rhinoscopy reveals congested mucosa over anterior aspect of inferior turbinates and intact septum.  No purulence noted. Oral:  Oral cavity and oropharynx are intact, symmetric, without erythema or edema.  Mucosa is moist without lesions. Neck: Full range of motion without pain.  There is no significant lymphadenopathy.  No masses palpable.  Thyroid  bed within normal limits to palpation.  Parotid glands and submandibular glands equal bilaterally without mass.  Trachea is midline. Neuro:  CN 2-12 grossly intact. Vestibular: No nystagmus at any point of gaze. Dix Hallpike negative. Vestibular: There is no nystagmus with pneumatic pressure on either tympanic membrane or Valsalva. The cerebellar examination is unremarkable.   Assessment & Plan Dizziness, possible viral laryngitis She experienced acute onset dizziness and a single episode of vertigo following a viral illness,  consistent with post-viral labyrinthitis. Symptoms have resolved, with no evidence of vestibular dysfunction. The condition is currently self-limited and benign. - Performed Dix-Hallpike maneuver; no nystagmus or vertigo elicited. - The pathophysiology of vestibular dysfunction and dizziness are discussed extensively with the patient. The possible differential diagnoses are reviewed. Questions are invited and answered.   - Advised that recurrence or persistence of dizziness may warrant referral to physical therapy for vestibular rehabilitation.  Tinnitus She reported a single episode of acute roaring noise in the left ear, followed by intermittent vague noises and occasional popping. Current symptoms are minimal and not bothersome. Tinnitus is suspected secondary to prior viral insult and possible transient hearing changes. No persistent or severe tinnitus. - Provided education regarding tinnitus, including its association with hearing loss and viral etiologies. - Discussed coping strategies, such as use of white noise or background sound for sleep disturbance. - Scheduled audiometry to assess for possible underlying hearing loss or other pathology.  Eustachian tube dysfunction She described intermittent vague popping noises in the left ear without otalgia or otorrhea. Symptoms are mild and likely due to transient eustachian tube dysfunction, possibly related to allergies or prior upper respiratory infection. Otoscopic examination revealed no middle ear effusion, normal tympanic membrane, and minimal cerumen. - Performed otoscopic examination confirming absence of middle ear effusion and normal tympanic membrane. - Advised monitoring for worsening symptoms or persistent aural fullness.   Reynolds Kittel W Leara Rawl 04/07/2024, 7:29 AM      [  1]  Allergies Allergen Reactions   Hydrochlorothiazide Other (See Comments)    Cold, feet felt like blocks of ice Cold, feet felt like blocks of ice    Diphenhydramine

## 2024-04-19 ENCOUNTER — Ambulatory Visit: Admitting: Internal Medicine

## 2024-04-20 ENCOUNTER — Ambulatory Visit
